# Patient Record
Sex: Female | Born: 1940 | Race: Black or African American | Hispanic: No | Smoking: Never smoker
Health system: Southern US, Community
[De-identification: ages and names within clinical notes are randomized; demographics above are authoritative.]

## PROBLEM LIST (undated history)

## (undated) DIAGNOSIS — H269 Unspecified cataract: Secondary | ICD-10-CM

## (undated) DIAGNOSIS — G4733 Obstructive sleep apnea (adult) (pediatric): Secondary | ICD-10-CM

## (undated) DIAGNOSIS — M1990 Unspecified osteoarthritis, unspecified site: Secondary | ICD-10-CM

## (undated) DIAGNOSIS — J301 Allergic rhinitis due to pollen: Secondary | ICD-10-CM

## (undated) DIAGNOSIS — J329 Chronic sinusitis, unspecified: Secondary | ICD-10-CM

## (undated) DIAGNOSIS — K529 Noninfective gastroenteritis and colitis, unspecified: Secondary | ICD-10-CM

## (undated) DIAGNOSIS — I2510 Atherosclerotic heart disease of native coronary artery without angina pectoris: Secondary | ICD-10-CM

## (undated) DIAGNOSIS — R05 Cough: Secondary | ICD-10-CM

## (undated) DIAGNOSIS — E785 Hyperlipidemia, unspecified: Secondary | ICD-10-CM

## (undated) DIAGNOSIS — G43809 Other migraine, not intractable, without status migrainosus: Secondary | ICD-10-CM

## (undated) DIAGNOSIS — K219 Gastro-esophageal reflux disease without esophagitis: Secondary | ICD-10-CM

## (undated) DIAGNOSIS — R059 Cough, unspecified: Secondary | ICD-10-CM

## (undated) DIAGNOSIS — F419 Anxiety disorder, unspecified: Secondary | ICD-10-CM

## (undated) DIAGNOSIS — I10 Essential (primary) hypertension: Secondary | ICD-10-CM

## (undated) DIAGNOSIS — J45909 Unspecified asthma, uncomplicated: Secondary | ICD-10-CM

## (undated) DIAGNOSIS — E039 Hypothyroidism, unspecified: Secondary | ICD-10-CM

## (undated) DIAGNOSIS — D649 Anemia, unspecified: Secondary | ICD-10-CM

## (undated) DIAGNOSIS — Z5189 Encounter for other specified aftercare: Secondary | ICD-10-CM

## (undated) DIAGNOSIS — T7840XA Allergy, unspecified, initial encounter: Secondary | ICD-10-CM

## (undated) DIAGNOSIS — IMO0001 Reserved for inherently not codable concepts without codable children: Secondary | ICD-10-CM

## (undated) HISTORY — PX: COLONOSCOPY: SHX174

## (undated) HISTORY — DX: Other migraine, not intractable, without status migrainosus: G43.809

## (undated) HISTORY — DX: Obstructive sleep apnea (adult) (pediatric): G47.33

## (undated) HISTORY — DX: Atherosclerotic heart disease of native coronary artery without angina pectoris: I25.10

## (undated) HISTORY — DX: Hyperlipidemia, unspecified: E78.5

## (undated) HISTORY — PX: CATARACT EXTRACTION W/ INTRAOCULAR LENS & ANTERIOR VITRECTOMY: SHX1308

## (undated) HISTORY — DX: Allergic rhinitis due to pollen: J30.1

## (undated) HISTORY — PX: EYE SURGERY: SHX253

## (undated) HISTORY — DX: Unspecified cataract: H26.9

## (undated) HISTORY — PX: PARATHYROID EXPLORATION: SHX732

## (undated) HISTORY — DX: Unspecified asthma, uncomplicated: J45.909

## (undated) HISTORY — DX: Allergy, unspecified, initial encounter: T78.40XA

## (undated) HISTORY — DX: Chronic sinusitis, unspecified: J32.9

## (undated) HISTORY — PX: POLYPECTOMY: SHX149

---

## 1966-07-11 HISTORY — PX: BREAST SURGERY: SHX581

## 1980-07-11 HISTORY — PX: TUBAL LIGATION: SHX77

## 1999-06-08 ENCOUNTER — Other Ambulatory Visit: Admission: RE | Admit: 1999-06-08 | Discharge: 1999-06-08 | Payer: Self-pay | Admitting: Obstetrics and Gynecology

## 1999-10-25 ENCOUNTER — Encounter (INDEPENDENT_AMBULATORY_CARE_PROVIDER_SITE_OTHER): Payer: Self-pay | Admitting: Specialist

## 1999-10-25 ENCOUNTER — Other Ambulatory Visit: Admission: RE | Admit: 1999-10-25 | Discharge: 1999-10-25 | Payer: Self-pay | Admitting: Obstetrics and Gynecology

## 2000-05-04 ENCOUNTER — Encounter: Admission: RE | Admit: 2000-05-04 | Discharge: 2000-05-04 | Payer: Self-pay | Admitting: *Deleted

## 2002-04-19 ENCOUNTER — Encounter: Payer: Self-pay | Admitting: Otolaryngology

## 2002-04-19 ENCOUNTER — Encounter: Admission: RE | Admit: 2002-04-19 | Discharge: 2002-04-19 | Payer: Self-pay | Admitting: Otolaryngology

## 2003-02-12 ENCOUNTER — Inpatient Hospital Stay (HOSPITAL_COMMUNITY): Admission: EM | Admit: 2003-02-12 | Discharge: 2003-02-13 | Payer: Self-pay | Admitting: Family Medicine

## 2003-02-12 ENCOUNTER — Encounter: Payer: Self-pay | Admitting: Family Medicine

## 2003-02-13 ENCOUNTER — Encounter: Payer: Self-pay | Admitting: Family Medicine

## 2003-03-06 ENCOUNTER — Encounter: Payer: Self-pay | Admitting: General Surgery

## 2003-03-10 ENCOUNTER — Encounter (INDEPENDENT_AMBULATORY_CARE_PROVIDER_SITE_OTHER): Payer: Self-pay | Admitting: *Deleted

## 2003-03-10 ENCOUNTER — Ambulatory Visit (HOSPITAL_COMMUNITY): Admission: RE | Admit: 2003-03-10 | Discharge: 2003-03-11 | Payer: Self-pay | Admitting: General Surgery

## 2005-07-01 ENCOUNTER — Encounter: Admission: RE | Admit: 2005-07-01 | Discharge: 2005-07-01 | Payer: Self-pay | Admitting: Pediatrics

## 2005-07-11 HISTORY — PX: THYROID SURGERY: SHX805

## 2006-10-06 ENCOUNTER — Ambulatory Visit (HOSPITAL_BASED_OUTPATIENT_CLINIC_OR_DEPARTMENT_OTHER): Admission: RE | Admit: 2006-10-06 | Discharge: 2006-10-06 | Payer: Self-pay | Admitting: Obstetrics and Gynecology

## 2006-10-06 ENCOUNTER — Encounter (INDEPENDENT_AMBULATORY_CARE_PROVIDER_SITE_OTHER): Payer: Self-pay | Admitting: *Deleted

## 2007-07-03 ENCOUNTER — Encounter: Admission: RE | Admit: 2007-07-03 | Discharge: 2007-07-03 | Payer: Self-pay | Admitting: Family Medicine

## 2009-06-09 ENCOUNTER — Emergency Department (HOSPITAL_COMMUNITY): Admission: EM | Admit: 2009-06-09 | Discharge: 2009-06-09 | Payer: Self-pay | Admitting: Emergency Medicine

## 2009-07-11 HISTORY — PX: CATARACT EXTRACTION W/ INTRAOCULAR LENS  IMPLANT, BILATERAL: SHX1307

## 2010-10-13 LAB — DIFFERENTIAL
Basophils Relative: 1 % (ref 0–1)
Eosinophils Absolute: 0.1 10*3/uL (ref 0.0–0.7)
Lymphocytes Relative: 28 % (ref 12–46)
Lymphs Abs: 1.2 10*3/uL (ref 0.7–4.0)
Monocytes Relative: 9 % (ref 3–12)
Neutro Abs: 2.7 10*3/uL (ref 1.7–7.7)
Neutrophils Relative %: 61 % (ref 43–77)

## 2010-10-13 LAB — POCT CARDIAC MARKERS
CKMB, poc: 2 ng/mL (ref 1.0–8.0)
Myoglobin, poc: 148 ng/mL (ref 12–200)
Troponin i, poc: <0.05 ng/mL (ref 0.00–0.09)

## 2010-10-13 LAB — CBC
MCHC: 33.5 g/dL (ref 30.0–36.0)
MCV: 86.6 fL (ref 78.0–100.0)
Platelets: 234 10*3/uL (ref 150–400)

## 2010-10-13 LAB — BASIC METABOLIC PANEL
BUN: 8 mg/dL (ref 6–23)
CO2: 34 mEq/L — ABNORMAL HIGH (ref 19–32)
Calcium: 9.4 mg/dL (ref 8.4–10.5)
GFR calc Af Amer: >60 mL/min (ref >60)
Sodium: 143 mEq/L (ref 135–145)

## 2010-11-26 NOTE — Op Note (Signed)
[name], Sara Baker                  ACCOUNT NO.:  [id number]   MEDICAL RECORD NO.:  [id number]          PATIENT TYPE:  AMB   LOCATION:  NESC                         FACILITY:  [facility]   PHYSICIAN:  Malachi Pro. Ambrose Mantle, M.D. DATE OF BIRTH:  30-Aug-1940   DATE OF PROCEDURE:  10/06/2006  DATE OF DISCHARGE:                               OPERATIVE REPORT   PREOPERATIVE DIAGNOSES:  Postmenopausal bleeding, endometrial polyp.   POSTOPERATIVE DIAGNOSES:  Postmenopausal bleeding, endometrial polyp.   OPERATION:  D&C, hysteroscopy, removal of large endometrial polyp.   OPERATOR:  Malachi Pro. Ambrose Mantle, M.D.   ANESTHESIA:  General.   The patient was brought to the operating room, placed under satisfactory  general anesthesia and placed in the dorsal lithotomy position.  Exam  revealed the uterus to be mid plane, normal size.  The adnexa were free  of masses.  The cervix was drawn into the operative field after prepping  with Betadine solution and draping as a sterile field and placing a  collection bag underneath the buttocks to collect the sorbitol.  The  uterus was sounded to about 3 inches, dilated up so that a hysteroscopic  sheath could be inserted containing a biopsy instrument, and then I was  able to see that the field was almost completely obscured by endometrial  polypoid tissue.  I therefore used the polyp forceps to try to reduce  the bulk of some of the tissue but could not seem to get a huge amount  of tissue.  Then with a combination of using the polyp forceps, uterine  dressing forceps and the hysteroscope as well as the hysteroscopic  biopsy instruments, I was able to reduce the bulk almost to zero but not  to zero.  There was a huge amount of tissue that was removed.  Some of  it appeared to possibly be consistent with neoplasia other than just a  simple polyp.  At the end of the procedure, a huge amount of tissue had  been removed.  The cavity was almost completely free of polypoid  tissue.  The cervical canal looked normal.  I did an endocervical curettage as  well as an endometrial curettage and preserved three specimens -- the  endocervical tissue, the endometrial tissue and the predominant amount  of tissue was from the endometrial polyp.   The patient seemed to tolerate the procedure well.  Blood loss was less  than 10 mL but during the procedure I did see some old blood which was  consistent with the patient's history of postmenopausal bleeding.  She  was returned to recovery in satisfactory condition.      Malachi Pro. Ambrose Mantle, M.D.  Electronically Signed     TFH/MEDQ  D:  10/06/2006  T:  10/06/2006  Job:  [id number]
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
## 2010-11-26 NOTE — Op Note (Signed)
NAMEVOULA, WALN                            ACCOUNT NO.:  [id number]   MEDICAL RECORD NO.:  [id number]                   PATIENT TYPE:  OIB   LOCATION:  2870                                 FACILITY:  [facility]   PHYSICIAN:  Sharlet Salina T. Hoxworth, M.D.          DATE OF BIRTH:  05-07-1941   DATE OF PROCEDURE:  03/10/2003  DATE OF DISCHARGE:                                 OPERATIVE REPORT   PREOPERATIVE DIAGNOSIS:  Parathyroid adenoma.   POSTOPERATIVE DIAGNOSIS:  Parathyroid adenoma.   OPERATION PERFORMED:  Minimally invasive parathyroidectomy.   SURGEON:  Lorne Skeens. Hoxworth, M.D.   ASSISTANT:  Velora Heckler, M.D.   ANESTHESIA:  General.   INDICATIONS FOR PROCEDURE:  Ms. Wolford is a 70 year old black female who was  recently found to have asymptomatic hypercalcemia, elevated at one point to  13.9.  She has undergone a thorough work-up that includes an elevated  parathyroid hormone level of 453 and a sestamibi scan indicating a left  inferior parathyroid adenoma.  A minimally invasive parathyroidectomy has  been recommended and accepted.  The nature of the procedure, its indications  and risks of bleeding, infection, possible need for full neck dissection,  injury to the recurrent laryngeal nerve with permanent hoarseness and  persistent hypercalcemia have been discussed and understood.  She is now  brought to the operating room for this procedure.   DESCRIPTION OF PROCEDURE:  The patient was brought to the operating room and  general endotracheal anesthesia was induced.  She had been injected in  nuclear medicine with sestamibi 75 minutes prior to the procedure.  Initially, the Neoprobe was used and there was an area of relatively  increased activity noted in the left inferior neck.  The neck was sterilely  prepped and draped.  A small about 2 cm incision was made in a skin crease  in the left neck transversely about 2 cm above the clavicle.  Platysma was  divided with the  cautery and small subplatysmal flaps were raised.  The  strap muscles were then divided along the midline and retracted laterally  and the anterior surface of the thyroid gland exposed.  The Neoprobe was  then again used to localize a relatively hot spot just off the inferior pole  of the left lobe of the thyroid.  Using careful blunt dissection and  mobilizing the thyroid gland medially, dissection was carried down onto a  very large typical-appearing parathyroid adenoma.  It was bluntly dissected  on its capsule away from the surrounding tissue and completely mobilized  down to its superior pedicle which was skeletonized down to small vessels  and clipped and divided and the gland completely removed.  It measured 3 cm  in length x 1 cm in diameter.  A small piece of Surgicel was placed in the  bed.  The tissue was sent for frozen section which confirmed parathyroid  tissue.  The  operative site was inspected for hemostasis which appeared  complete. The strap muscles were then closed  in the midline with  interrupted 3-0 Vicryl.  The platysma was closed with interrupted 3-0 Vicryl  and the skin closed with three staples and Steri-Strips.  The sponge, needle  and instrument counts were correct.  A dry sterile dressing was applied.  The patient was then transferred to the recovery room in good condition.                                                 Lorne Skeens. Hoxworth, M.D.    Tory Emerald  D:  03/10/2003  T:  03/10/2003  Job:  [id number]   cc:   Gretta Arab. Valentina Lucks, M.D.  [address]  [place]  [place] [address]  Fax: [phone]   Lilyan Punt. Sydnee Levans, M.D.  [address] [place]  [place] [address]  Fax: [phone]   Donia Guiles, M.D.  [address] [place]  [place] [address]  Fax: [phone]

## 2010-11-26 NOTE — Consult Note (Signed)
NAMEBRENDIA, Baker                            ACCOUNT NO.:  [id number]   MEDICAL RECORD NO.:  [id number]                   PATIENT TYPE:  OBV   LOCATION:  0345                                 FACILITY:  [facility]   PHYSICIAN:  Sara Baker, M.D.          DATE OF BIRTH:  [birth year]   DATE OF CONSULTATION:  02/13/2003  DATE OF DISCHARGE:                                   CONSULTATION   CHIEF COMPLAINT:  Elevated calcium.   HISTORY OF PRESENT ILLNESS:  Sara Baker is a 71 year old black female  admitted to [facility] yesterday by Sara Baker. Sara Baker, M.D. for  marked hypercalcemia.  I was asked to see the patient in consultation.  She  recently was seen as an outpatient at Sara Baker, M.D. office and  apparently routine laboratory work was done which has revealed a markedly  elevated calcium of 13.9.  The patient has had moderately elevated calcium  noted previously 11.9 in May of 2003, 13.3 two weeks ago.  At that time PTH  was also elevated 362.  The patient on questioning does state she has noted  increased fatigue and lethargy over the past four months.  No specific  muscle weakness.  Her daughter states she has noted some occasional facial  twitching.  She has not had any bone pain, pathologic fractures, or kidney  stones.  There is no history of malignancy.  Other than the fatigue, she has  really been asymptomatic.   PAST SURGICAL HISTORY:  1. Benign breast biopsy many years ago.  2. Bilateral tubal ligation.   PAST MEDICAL HISTORY:  1. Hypertension.  2. Depression/anxiety.  3. Ulcerative colitis by Sara Baker. Sara Baker, M.D. [facility].   CURRENT MEDICATIONS:  1. Xanax.  2. Hydrochlorothiazide.  3. Nexium.   ALLERGIES:  No known drug allergies.   SOCIAL HISTORY:  She is married.  Does not smoke cigarettes or drink  alcohol.   FAMILY HISTORY:  Mother died of a stroke.  Father died of a stroke.  No  history of cancer.   REVIEW OF SYSTEMS:  GENERAL:   Positive for some fatigue and lethargy as  above.  No fever, chills.  HEENT:  No neck pain, lump, difficulty  swallowing.  RESPIRATORY:  Denies shortness of breath, cough, wheezing.  CARDIAC:  She has had occasional chest pain and has negative Cardiolite.  This was felt due to anxiety.  No palpitations.  BREASTS:  No lumps,  tenderness, discharge.  ABDOMEN:  She has occasional alternating  constipation, diarrhea, and some crampy abdominal pain.  Followed by colitis  for Sara Baker. Sara Baker, M.D. [facility].  EXTREMITIES:  She has had some pain in  her right ankle and occasional swelling of the right foot.   PHYSICAL EXAMINATION:  VITAL SIGNS:  She is afebrile.  Pulse 100,  respirations 20, blood pressure 146/89.  GENERAL:  Well-developed black female in no acute distress.  SKIN:  Warm and dry without rash or infection.  HEENT:  No palpable masses or thyromegaly.  Sclerae nonicteric.  Naso/oropharynx clear.  LYMPH NODES:  No cervical, supraclavicular, axillary, or inguinal nodes  palpable.  BREASTS:  Without masses, tenderness, skin change, discharge.  LUNGS:  Clear to auscultation.  CARDIAC:  Regular rate and rhythm without murmurs.  No JVD or edema.  ABDOMEN:  Mildly distended, soft without tenderness, palpable masses, or  organomegaly.  EXTREMITIES:  No deformity, joint swelling.  NEUROLOGIC:  She is alert, fully oriented.  Motor and sensory are grossly  normal.   LABORATORIES:  CBC, BMET within normal limits.  Alkaline phosphatase  elevated at 134.  Calcium initially 13.9, now decreased to 11.4 post IV  fluids and Lasix.  Parathyroid hormone repeat elevated at 453.   CT scan of the abdomen and pelvis obtained which shows gallstones and a  contracted gallbladder.  There are very small low density areas in the liver  felt to be cysts.   Parathyroid scan was obtained __________ Sestamibi which shows persistent  increased uptake in the left inferior area consistent with a parathyroid   adenoma.   ASSESSMENT/PLAN:  Essentially asymptomatic marked hypercalcemia with  markedly elevated parathyroid hormone and Sestamibi scan indicating left  inferior adenoma.  I think the patient would benefit from minimally invasive  parathyroidectomy.  The patient from my point of view appears stable for  discharge and we will see her back in the office and schedule her procedure  at [facility] where the __________ is available for localization.  The  procedure was discussed initially with the patient today and will be  discussed further in the office.                                               Sara Baker. Baker, M.D.    Sara Baker  D:  02/13/2003  T:  02/13/2003  Job:  [id number]

## 2010-11-26 NOTE — Discharge Summary (Signed)
Sara Baker, DULA                            ACCOUNT NO.:  [id number]   MEDICAL RECORD NO.:  [id number]                   PATIENT TYPE:  OBV   LOCATION:  0345                                 FACILITY:  [facility]   PHYSICIAN:  Lilyan Punt. Sydnee Levans, M.D.             DATE OF BIRTH:  1940-08-04   DATE OF ADMISSION:  02/12/2003  DATE OF DISCHARGE:  02/13/2003                                 DISCHARGE SUMMARY   DIAGNOSES:  1. Parathyroid adenoma, left inferior parathyroid gland, per sestamibi scan     on February 13, 2003 at [facility]  2. Asymptomatic hypercalcemia secondary to above.  3. Hypertension.   DISCHARGE MEDICATIONS:  1. Lasix 20 mg b.i.d.  2. Potassium 20 mEq p.o. b.i.d.   FOLLOW UP:  1. With Dr. Johna Sheriff next week in his office for preoperative assessment     before the minimally invasive parathyroid surgery which will be done at     [facility].  2. Dr. Maurice Small next week for lab assessment and BMET.  3. [facility] as previously arranged by Dr. Valentina Lucks in work-up of     atypical chest pain - February 17, 2003.   DISCUSSION:  This patient presented with asymptomatic hypercalcemia to Dr.  Marcy Siren office and was sent directly to the hospital by Dr.  __________ after followup lab testing showed hypercalcemia with calcium  level of 13.9, and an elevated PTH of 361.  The patient underwent saline  diuresis to acutely manage hypercalcemia.  The thiazide diuretic that she  had been taking was also discontinued.  The patient responded well to this  therapy.  She was seen by surgery with his findings and recommendations  discussed; see dictation.   Hospital work-up has included CT of the pelvis and abdomen which showed  findings of minimal significance at this time.  There were sub-centimeter  low density lesions involving the right and left lobes of the liver which  were too small to characterize, but could represent hepatic or biliary  cysts.  The gallbladder was grossly normal, but had incidental findings of  cholelithiasis.  There is a decrease in size of the left kidney, which could  be consistent with renal artery stenosis.  There is an incidental  diverticulum without evidence of obstruction in the third portion of the  duodenum.  These findings were felt to be incidental, and should be followed  in the outpatient setting by the primary physician and surgeon once the  primary issue or parathyroid adenoma has been definitively treated.    PERTINENT LABORATORIES:  TSH was 2.022.  Ionized calcium 1.81.  BMET  revealing a sodium of 141, potassium 4.1, chloride 111, CO2 28, glucose 19,  BUN 8, creatinine 0.9.  Alkaline phosphatase was 121.  Parathyroid hormone  intact at 453.7.  Phosphorus 2.5, magnesium 1.8, PTT 33, PT 12.8.  CBC  revealed  a normal white blood cell count at 4.4, hemoglobin 12.0, hematocrit  36.2, platelet count 190.                                               Lilyan Punt Sydnee Levans, M.D.    KCS/MEDQ  D:  02/13/2003  T:  02/13/2003  Job:  [id number]   cc:   Gretta Arab. Valentina Lucks, M.D.  [address] [place]  [place] [address]  Fax: [phone]   Lorne Skeens. Hoxworth, M.D.  [address]  [place]  [place] [address]  Fax: [phone]   [facility]

## 2011-07-12 HISTORY — PX: DILATION AND CURETTAGE OF UTERUS: SHX78

## 2011-07-19 DIAGNOSIS — E039 Hypothyroidism, unspecified: Secondary | ICD-10-CM | POA: Diagnosis not present

## 2011-07-19 DIAGNOSIS — J329 Chronic sinusitis, unspecified: Secondary | ICD-10-CM | POA: Diagnosis not present

## 2011-09-01 ENCOUNTER — Encounter (HOSPITAL_COMMUNITY): Payer: Self-pay | Admitting: Pharmacist

## 2011-09-05 ENCOUNTER — Other Ambulatory Visit: Payer: Self-pay

## 2011-09-05 ENCOUNTER — Encounter (HOSPITAL_COMMUNITY): Payer: Self-pay

## 2011-09-05 ENCOUNTER — Encounter (HOSPITAL_COMMUNITY)
Admission: RE | Admit: 2011-09-05 | Discharge: 2011-09-05 | Disposition: A | Discharge status: Home / Self Care | Payer: BC Managed Care – PPO | Source: Ambulatory Visit | Attending: Obstetrics and Gynecology | Admitting: Obstetrics and Gynecology

## 2011-09-05 HISTORY — DX: Hypothyroidism, unspecified: E03.9

## 2011-09-05 HISTORY — DX: Encounter for other specified aftercare: Z51.89

## 2011-09-05 HISTORY — DX: Noninfective gastroenteritis and colitis, unspecified: K52.9

## 2011-09-05 HISTORY — DX: Anxiety disorder, unspecified: F41.9

## 2011-09-05 HISTORY — DX: Essential (primary) hypertension: I10

## 2011-09-05 HISTORY — DX: Gastro-esophageal reflux disease without esophagitis: K21.9

## 2011-09-05 HISTORY — DX: Reserved for inherently not codable concepts without codable children: IMO0001

## 2011-09-05 LAB — CBC
Hemoglobin: 12.8 g/dL (ref 12.0–15.0)
RBC: 4.68 MIL/uL (ref 3.87–5.11)

## 2011-09-05 LAB — BASIC METABOLIC PANEL: BUN: 10 mg/dL (ref 6–23)

## 2011-09-05 NOTE — Patient Instructions (Signed)
YOUR PROCEDURE IS SCHEDULED ON:09/14/11  ENTER THROUGH THE MAIN ENTRANCE OF [facility] AT:0730 am  USE DESK PHONE AND DIAL [phone] TO INFORM us OF YOUR ARRIVAL  CALL [phone] IF YOU HAVE ANY QUESTIONS OR PROBLEMS PRIOR TO YOUR ARRIVAL.  REMEMBER: DO NOT EAT OR DRINK AFTER MIDNIGHT :Tuesday  SPECIAL INSTRUCTIONS:   YOU MAY BRUSH YOUR TEETH THE MORNING OF SURGERY   TAKE THESE MEDICINES THE DAY OF SURGERY WITH SIP OF WATER: BP pill   DO NOT WEAR JEWELRY, EYE MAKEUP, LIPSTICK OR DARK FINGERNAIL POLISH DO NOT WEAR LOTIONS DO NOT SHAVE FOR 48 HOURS PRIOR TO SURGERY  YOU WILL NOT BE ALLOWED TO DRIVE YOURSELF HOME.  NAME OF DRIVER:daughter- Elnita Maxwell

## 2011-09-06 NOTE — Pre-Procedure Instructions (Signed)
Results of low K+ given to [employer] (3.2). Protocol requires we report results < 3.1 but pt not having surgery for another week. In an attempt to keep K+ from becoming lower, I called pt to advise to eat bananas daily until surgery  if she is able. Dr. Jean Rosenthal approved of me calling pt with these instructions.

## 2011-09-08 DIAGNOSIS — I10 Essential (primary) hypertension: Secondary | ICD-10-CM | POA: Diagnosis not present

## 2011-09-08 DIAGNOSIS — J309 Allergic rhinitis, unspecified: Secondary | ICD-10-CM | POA: Diagnosis not present

## 2011-09-12 NOTE — H&P (Signed)
NAME:  Sara Baker, Sara Baker                       ACCOUNT NO.:  MEDICAL RECORD NO.:  [id number]  LOCATION:                                 FACILITY:  PHYSICIAN:  Malachi Pro. Ambrose Mantle, M.D. DATE OF BIRTH:  August 26, 1940  DATE OF ADMISSION: DATE OF DISCHARGE:                             HISTORY & PHYSICAL   ESTIMATED DATE OF ADMISSION:  September 14, 2011.  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  This patient was seen on August 15, 2011, complaining that she had begun bleeding 2 weeks prior to that after 29 years without any bleeding.  Her Pap smear was normal. Her endometrial biopsy was reported as previously stated.  She was scheduled for D and C to rule out a more significant lesion and to get a microscopic evaluation of the entire endometrium.  PAST MEDICAL HISTORY:  Her past medical history reveals, NO KNOWN DRUG ALLERGIES, illnesses, high blood pressure, colitis, and anxiety.  PAST SURGICAL HISTORY:  Bilateral tubal ligation, breast mass, parathyroidectomy, and cataracts on both eyes.  MEDICATIONS:  Benazepril and hydrochlorothiazide 20/25 one a day.  FAMILY HISTORY:  Mother died at 8 of high blood pressure and stroke. Father died at 7 of high blood pressure and stroke.  She had 6 brothers and 6 sisters; only 2 sisters remain.  There is a combination of illnesses including alcoholism, cancer, lymphoma, and myocardial infarction.  Alcohol, tobacco, and drugs none.  The patient did have 5 vaginal deliveries.  PHYSICAL EXAMINATION:  GENERAL:  This is a well-developed, well- nourished black female in no distress. VITAL SIGNS:  Her blood pressure is 160/90, pulse is 80. HEAD, EYES, NOSE, AND THROAT:  No abnormalities.  She has an upper dental plate and a lower partial plate. NECK:   Supple without thyromegaly. LUNGS:  Clear to auscultation. HEART:  Normal size and sounds.  No murmurs. ABDOMEN:  Soft and nontender.  There are no masses palpable.  Liver, spleen, and kidneys are not felt. GU:  The pelvic exam is not repeated, done on August 15, 2011.  Her external genitalia were normal.  The vagina had no discharge at that time.  The cervix was healthy in appearance.  The uterus was nontender, thought to be midplane, normal size.  The adnexa were free of masses and rectal exam was negative.  ADMITTING IMPRESSION:  History of postmenopausal bleeding, endometrial hyperplasia with cytologic atypia on endometrial biopsy.  The patient is admitted for D and C and hysteroscopy.  At the time the endometrial biopsy was done, the Pipelle felt that it pulled on a structure and could not retrieve the structure suggesting that there was a polyp present.  The risks of the surgery have been discussed with the patient.     Malachi Pro. Ambrose Mantle, M.D.     TFH/MEDQ  D:  09/12/2011  T:  09/12/2011  Job:  [id number]

## 2011-09-14 ENCOUNTER — Encounter (HOSPITAL_COMMUNITY): Payer: Self-pay | Admitting: Anesthesiology

## 2011-09-14 ENCOUNTER — Ambulatory Visit (HOSPITAL_COMMUNITY)
Admission: RE | Admit: 2011-09-14 | Discharge: 2011-09-14 | Disposition: A | Discharge status: Home / Self Care | Payer: BC Managed Care – PPO | Source: Ambulatory Visit | Attending: Obstetrics and Gynecology | Admitting: Obstetrics and Gynecology

## 2011-09-14 ENCOUNTER — Encounter (HOSPITAL_COMMUNITY): Payer: Self-pay | Admitting: *Deleted

## 2011-09-14 ENCOUNTER — Ambulatory Visit (HOSPITAL_COMMUNITY): Payer: BC Managed Care – PPO | Admitting: Anesthesiology

## 2011-09-14 ENCOUNTER — Encounter (HOSPITAL_COMMUNITY)
Admission: RE | Disposition: A | Discharge status: Home / Self Care | Payer: Self-pay | Source: Ambulatory Visit | Attending: Obstetrics and Gynecology

## 2011-09-14 DIAGNOSIS — N950 Postmenopausal bleeding: Secondary | ICD-10-CM | POA: Insufficient documentation

## 2011-09-14 DIAGNOSIS — N840 Polyp of corpus uteri: Secondary | ICD-10-CM | POA: Insufficient documentation

## 2011-09-14 DIAGNOSIS — Z01818 Encounter for other preprocedural examination: Secondary | ICD-10-CM | POA: Insufficient documentation

## 2011-09-14 DIAGNOSIS — N8502 Endometrial intraepithelial neoplasia [EIN]: Secondary | ICD-10-CM

## 2011-09-14 DIAGNOSIS — Z01812 Encounter for preprocedural laboratory examination: Secondary | ICD-10-CM | POA: Insufficient documentation

## 2011-09-14 SURGERY — DILATATION & CURETTAGE/HYSTEROSCOPY WITH RESECTOCOPE
Anesthesia: General | Site: Vagina | Wound class: Clean Contaminated

## 2011-09-14 MED ORDER — PHENYLEPHRINE HCL 10 MG/ML IJ SOLN
INTRAMUSCULAR | Status: DC | PRN
  Administered 2011-09-14 (×2): 80 ug via INTRAVENOUS
  Administered 2011-09-14: 40 ug via INTRAVENOUS

## 2011-09-14 MED ORDER — GLYCINE 1.5 % IR SOLN
Status: DC | PRN
  Administered 2011-09-14: 3000 mL

## 2011-09-14 MED ORDER — DEXAMETHASONE SODIUM PHOSPHATE 4 MG/ML IJ SOLN
INTRAMUSCULAR | Status: DC | PRN
  Administered 2011-09-14: 10 mg via INTRAVENOUS

## 2011-09-14 MED ORDER — LIDOCAINE HCL (CARDIAC) 20 MG/ML IV SOLN
INTRAVENOUS | Status: DC | PRN
  Administered 2011-09-14: 100 mg via INTRAVENOUS

## 2011-09-14 MED ORDER — LACTATED RINGERS IV SOLN
INTRAVENOUS | Status: DC
  Administered 2011-09-14 (×3): via INTRAVENOUS

## 2011-09-14 MED ORDER — FENTANYL CITRATE 0.05 MG/ML IJ SOLN: 25.0000 ug | INTRAMUSCULAR | Status: DC | PRN

## 2011-09-14 MED ORDER — DEXAMETHASONE SODIUM PHOSPHATE 10 MG/ML IJ SOLN
INTRAMUSCULAR | Status: AC
  Filled 2011-09-14: qty 1

## 2011-09-14 MED ORDER — KETOROLAC TROMETHAMINE 30 MG/ML IJ SOLN
INTRAMUSCULAR | Status: DC | PRN
  Administered 2011-09-14: 30 mg via INTRAVENOUS

## 2011-09-14 MED ORDER — FENTANYL CITRATE 0.05 MG/ML IJ SOLN
INTRAMUSCULAR | Status: DC | PRN
  Administered 2011-09-14: 50 ug via INTRAVENOUS

## 2011-09-14 MED ORDER — PROPOFOL 10 MG/ML IV EMUL
INTRAVENOUS | Status: AC
  Filled 2011-09-14: qty 20

## 2011-09-14 MED ORDER — ONDANSETRON HCL 4 MG/2ML IJ SOLN
INTRAMUSCULAR | Status: AC
  Filled 2011-09-14: qty 2

## 2011-09-14 MED ORDER — ONDANSETRON HCL 4 MG/2ML IJ SOLN
INTRAMUSCULAR | Status: DC | PRN
  Administered 2011-09-14: 4 mg via INTRAVENOUS

## 2011-09-14 MED ORDER — PROPOFOL 10 MG/ML IV EMUL
INTRAVENOUS | Status: DC | PRN
  Administered 2011-09-14: 200 mg via INTRAVENOUS

## 2011-09-14 MED ORDER — LIDOCAINE HCL (CARDIAC) 20 MG/ML IV SOLN
INTRAVENOUS | Status: AC
  Filled 2011-09-14: qty 5

## 2011-09-14 MED ORDER — MIDAZOLAM HCL 5 MG/5ML IJ SOLN
INTRAMUSCULAR | Status: DC | PRN
  Administered 2011-09-14: 2 mg via INTRAVENOUS

## 2011-09-14 MED ORDER — LIDOCAINE HCL 1 % IJ SOLN
INTRAMUSCULAR | Status: DC | PRN
  Administered 2011-09-14: 9 mL

## 2011-09-14 MED ORDER — MIDAZOLAM HCL 2 MG/2ML IJ SOLN
INTRAMUSCULAR | Status: AC
  Filled 2011-09-14: qty 6

## 2011-09-14 MED ORDER — FENTANYL CITRATE 0.05 MG/ML IJ SOLN
INTRAMUSCULAR | Status: AC
  Filled 2011-09-14: qty 5

## 2011-09-14 MED ORDER — ACETAMINOPHEN 325 MG PO TABS: 325.0000 mg | ORAL_TABLET | ORAL | Status: DC | PRN

## 2011-09-14 SURGICAL SUPPLY — 19 items
CANISTER SUCTION 2500CC (MISCELLANEOUS) ×2 IMPLANT
CATH ROBINSON RED A/P 16FR (CATHETERS) ×2 IMPLANT
CLOTH BEACON ORANGE TIMEOUT ST (SAFETY) ×2 IMPLANT
CONTAINER PREFILL 10% NBF 60ML (FORM) ×4 IMPLANT
CORD ACTIVE DISPOSABLE (ELECTRODE)
CORD ELECTRO ACTIVE DISP (ELECTRODE) IMPLANT
ELECT LOOP GYNE PRO 24FR (CUTTING LOOP)
ELECT REM PT RETURN 9FT ADLT (ELECTROSURGICAL) ×2
ELECT VAPORTRODE GRVD BAR (ELECTRODE) IMPLANT
ELECTRODE LOOP GYNE PRO 24FR (CUTTING LOOP) IMPLANT
ELECTRODE REM PT RTRN 9FT ADLT (ELECTROSURGICAL) ×1 IMPLANT
GLOVE BIO SURGEON STRL SZ7.5 (GLOVE) ×4 IMPLANT
GOWN PREVENTION PLUS LG XLONG (DISPOSABLE) ×2 IMPLANT
GOWN PREVENTION PLUS XLARGE (GOWN DISPOSABLE) ×2 IMPLANT
GOWN STRL REIN XL XLG (GOWN DISPOSABLE) ×2 IMPLANT
LOOP ANGLED CUTTING 22FR (CUTTING LOOP) IMPLANT
PACK HYSTEROSCOPY LF (CUSTOM PROCEDURE TRAY) ×2 IMPLANT
TOWEL OR 17X24 6PK STRL BLUE (TOWEL DISPOSABLE) ×4 IMPLANT
WATER STERILE IRR 1000ML POUR (IV SOLUTION) ×2 IMPLANT

## 2011-09-14 NOTE — Anesthesia Preprocedure Evaluation (Signed)
Anesthesia Evaluation  Patient identified by MRN, date of birth, ID band Patient awake    Reviewed: Allergy & Precautions, H&P , Patient's Chart, lab work & pertinent test results, reviewed documented beta blocker date and time   History of Anesthesia Complications Negative for: history of anesthetic complications  Airway Mallampati: II TM Distance: >3 FB Neck ROM: limited    Dental No notable dental hx.    Pulmonary neg pulmonary ROS,  breath sounds clear to auscultation  Pulmonary exam normal       Cardiovascular Exercise Tolerance: Good hypertension, negative cardio ROS  Rhythm:regular Rate:Normal     Neuro/Psych PSYCHIATRIC DISORDERS Anxiety negative neurological ROS  negative psych ROS   GI/Hepatic negative GI ROS, Neg liver ROS,   Endo/Other  negative endocrine ROS  Renal/GU negative Renal ROS     Musculoskeletal   Abdominal   Peds  Hematology negative hematology ROS (+)   Anesthesia Other Findings Hypertension     Anxiety        Hypothyroidism     GERD (gastroesophageal reflux disease)        Blood transfusion 10 + yrs Wash DC Colitis    Reproductive/Obstetrics negative OB ROS                           Anesthesia Physical Anesthesia Plan  ASA: II  Anesthesia Plan: General LMA   Post-op Pain Management:    Induction:   Airway Management Planned:   Additional Equipment:   Intra-op Plan:   Post-operative Plan:   Informed Consent: I have reviewed the patients History and Physical, chart, labs and discussed the procedure including the risks, benefits and alternatives for the proposed anesthesia with the patient or authorized representative who has indicated his/her understanding and acceptance.   Dental Advisory Given  Plan Discussed with: CRNA, Surgeon and Anesthesiologist  Anesthesia Plan Comments:         Anesthesia Quick Evaluation

## 2011-09-14 NOTE — Op Note (Signed)
Operative note on Sara Baker:  Date of the operation: 09/14/2011  Preoperative diagnosis: Postmenopausal bleeding, atypical endometrial hyperplasia  Postoperative diagnosis: Multiple endometrial polyps with some firm and yellow tissue removed during the polypectomies  Operation: D&C, hysteroscopy removal of polyps  Operator: Ambrose Mantle  Anesthesia: Gen. Dr. Brayton Caves  The patient was brought to the operating room and placed under satisfactory general anesthesia and placed in lithotomy position. The vulva vagina perineum and urethra were prepped with Betadine solution and the bladder was emptied with a [place] catheter. There was a significant cystocele and prolapse of the cervix. The area was draped as a sterile field, the cervix was exposed with a weighted speculum posteriorly and a Sims retractor anteriorly, and the anterior cervical lip was grasped with a tenaculum. The uterus sounded  8 cm anteriorly. The cervix was dilated with Shawnie Pons dilators so it would easily admit the hysteroscope. Multiple polyps were seen inside the endometrial cavity. I removed the hysteroscope and began debulking the polyps with polyp forceps and with the baby ring forceps. There was a large amount of tissue removed including some tissue that was yellow and firm. I looked again with the hysteroscope and there was no significant polypoid tissue remaining. I did a fractional D&C and terminated the procedure. Blood loss was about 5 cc. The patient was returned to recovery in satisfactory condition

## 2011-09-14 NOTE — Anesthesia Postprocedure Evaluation (Signed)
Anesthesia Post Note  Patient: Sara Baker  Procedure(s) Performed: Procedure(s) (LRB): DILATATION & CURETTAGE/HYSTEROSCOPY WITH RESECTOCOPE (N/A)  Anesthesia type: General  Patient location: PACU  Post pain: Pain level controlled  Post assessment: Post-op Vital signs reviewed  Last Vitals:  Filed Vitals:   09/14/11 1120  BP:   Pulse: 84  Temp:   Resp: 11    Post vital signs: Reviewed  Level of consciousness: sedated  Complications: No apparent anesthesia complications

## 2011-09-14 NOTE — Progress Notes (Signed)
Patient ID: Sara Baker, female   DOB: July 26, 1940, 71 y.o.   MRN: [id number] Pt was examined on 09-12-11 and she reports no major change in her health since that time.

## 2011-09-14 NOTE — Transfer of Care (Signed)
Immediate Anesthesia Transfer of Care Note  Patient: Sara Baker  Procedure(s) Performed: Procedure(s) (LRB): DILATATION & CURETTAGE/HYSTEROSCOPY WITH RESECTOCOPE (N/A)  Patient Location: PACU  Anesthesia Type: General  Level of Consciousness: awake, alert  and oriented  Airway & Oxygen Therapy: Patient Spontanous Breathing and Patient connected to nasal cannula oxygen  Post-op Assessment: Report given to PACU RN and Post -op Vital signs reviewed and stable  Post vital signs: Reviewed and stable  Complications: No apparent anesthesia complications

## 2011-09-14 NOTE — Anesthesia Procedure Notes (Signed)
Procedure Name: LMA Insertion Date/Time: 09/14/2011 9:11 AM Performed by: Edison Pace Pre-anesthesia Checklist: Patient identified, Patient being monitored, Emergency Drugs available and Timeout performed Patient Re-evaluated:Patient Re-evaluated prior to inductionOxygen Delivery Method: Circle system utilized Preoxygenation: Pre-oxygenation with 100% oxygen Intubation Type: IV induction Ventilation: Mask ventilation without difficulty LMA: LMA inserted LMA Size: 4.0 Number of attempts: 1

## 2011-09-14 NOTE — Discharge Instructions (Signed)
No vaginal entrance, call with temp > 100.4, with heavy vaginal bleeding , or with any problems.DISCHARGE INSTRUCTIONS: D&C / D&E The following instructions have been prepared to help you care for yourself upon your return home.   Personal hygiene: Marland Kitchen Use sanitary pads for vaginal drainage, not tampons. . Shower the day after your procedure. . NO tub baths, pools or Jacuzzis for 2-3 weeks. . Wipe front to back after using the bathroom.  Activity and limitations: . Do NOT drive or operate any equipment for 24 hours. The effects of anesthesia are still present and drowsiness may result. . Do NOT rest in bed all day. . Walking is encouraged. . Walk up and down stairs slowly. . You may resume your normal activity in one to two days or as indicated by your physician.  Sexual activity: NO intercourse for at least 2 weeks after the procedure, or as indicated by your physician.  Diet: Eat a light meal as desired this evening. You may resume your usual diet tomorrow.  Return to work: You may resume your work activities in one to two days or as indicated by your doctor.  What to expect after your surgery: Expect to have vaginal bleeding/discharge for 2-3 days and spotting for up to 10 days. It is not unusual to have soreness for up to 1-2 weeks. You may have a slight burning sensation when you urinate for the first day. Mild cramps may continue for a couple of days. You may have a regular period in 2-6 weeks.  Call your doctor for any of the following: . Excessive vaginal bleeding, saturating and changing one pad every hour. . Inability to urinate 6 hours after discharge from hospital. . Pain not relieved by pain medication. . Fever of 100.4 F or greater. . Unusual vaginal discharge or odor.  Return to office ________________ Call for an appointment ___________________  Patient's signature: ______________________  Nurse's signature ________________________  Post Anesthesia Care Unit  [phone]

## 2011-10-26 DIAGNOSIS — I10 Essential (primary) hypertension: Secondary | ICD-10-CM | POA: Diagnosis not present

## 2011-10-26 DIAGNOSIS — R609 Edema, unspecified: Secondary | ICD-10-CM | POA: Diagnosis not present

## 2011-11-07 ENCOUNTER — Encounter: Payer: Self-pay | Admitting: Gastroenterology

## 2011-12-15 ENCOUNTER — Ambulatory Visit (AMBULATORY_SURGERY_CENTER): Payer: BC Managed Care – PPO | Admitting: *Deleted

## 2011-12-15 VITALS — Ht 64.5 in | Wt 180.0 lb

## 2011-12-15 DIAGNOSIS — Z1211 Encounter for screening for malignant neoplasm of colon: Secondary | ICD-10-CM

## 2011-12-15 MED ORDER — PEG-KCL-NACL-NASULF-NA ASC-C 100 G PO SOLR: ORAL | Status: DC

## 2011-12-28 ENCOUNTER — Ambulatory Visit (AMBULATORY_SURGERY_CENTER): Payer: BC Managed Care – PPO | Admitting: Gastroenterology

## 2011-12-28 ENCOUNTER — Encounter: Payer: Self-pay | Admitting: Gastroenterology

## 2011-12-28 VITALS — BP 145/86 | HR 84 | Temp 97.1°F | Resp 11 | Ht 64.0 in | Wt 180.0 lb

## 2011-12-28 DIAGNOSIS — Z1211 Encounter for screening for malignant neoplasm of colon: Secondary | ICD-10-CM

## 2011-12-28 DIAGNOSIS — D126 Benign neoplasm of colon, unspecified: Secondary | ICD-10-CM | POA: Diagnosis not present

## 2011-12-28 DIAGNOSIS — F411 Generalized anxiety disorder: Secondary | ICD-10-CM | POA: Diagnosis not present

## 2011-12-28 MED ORDER — SODIUM CHLORIDE 0.9 % IV SOLN: 500.0000 mL | INTRAVENOUS | Status: DC

## 2011-12-28 NOTE — Patient Instructions (Addendum)
YOU HAD AN ENDOSCOPIC PROCEDURE TODAY AT THE [facility]: Refer to the procedure report that was given to you for any specific questions about what was found during the examination.  If the procedure report does not answer your questions, please call your gastroenterologist to clarify.  If you requested that your care partner not be given the details of your procedure findings, then the procedure report has been included in a sealed envelope for you to review at your convenience later.  YOU SHOULD EXPECT: Some feelings of bloating in the abdomen. Passage of more gas than usual.  Walking can help get rid of the air that was put into your GI tract during the procedure and reduce the bloating. If you had a lower endoscopy (such as a colonoscopy or flexible sigmoidoscopy) you may notice spotting of blood in your stool or on the toilet paper. If you underwent a bowel prep for your procedure, then you may not have a normal bowel movement for a few days.  DIET: Your first meal following the procedure should be a light meal and then it is ok to progress to your normal diet.  A half-sandwich or bowl of soup is an example of a good first meal.  Heavy or fried foods are harder to digest and may make you feel nauseous or bloated.  Likewise meals heavy in dairy and vegetables can cause extra gas to form and this can also increase the bloating.  Drink plenty of fluids but you should avoid alcoholic beverages for 24 hours.  ACTIVITY: Your care partner should take you home directly after the procedure.  You should plan to take it easy, moving slowly for the rest of the day.  You can resume normal activity the day after the procedure however you should NOT DRIVE or use heavy machinery for 24 hours (because of the sedation medicines used during the test).    SYMPTOMS TO REPORT IMMEDIATELY: A gastroenterologist can be reached at any hour.  During normal business hours, 8:30 AM to 5:00 PM Monday through Friday,  call [phone].  After hours and on weekends, please call the GI answering service at [phone] who will take a message and have the physician on call contact you.   Following lower endoscopy (colonoscopy or flexible sigmoidoscopy):  Excessive amounts of blood in the stool  Significant tenderness or worsening of abdominal pains  Swelling of the abdomen that is new, acute  Fever of 100F or higher    FOLLOW UP: If any biopsies were taken you will be contacted by phone or by letter within the next 1-3 weeks.  Call your gastroenterologist if you have not heard about the biopsies in 3 weeks.  Our staff will call the home number listed on your records the next business day following your procedure to check on you and address any questions or concerns that you may have at that time regarding the information given to you following your procedure. This is a courtesy call and so if there is no answer at the home number and we have not heard from you through the emergency physician on call, we will assume that you have returned to your regular daily activities without incident.  SIGNATURES/CONFIDENTIALITY: You and/or your care partner have signed paperwork which will be entered into your electronic medical record.  These signatures attest to the fact that that the information above on your After Visit Summary has been reviewed and is understood.  Full responsibility of the confidentiality   of this discharge information lies with you and/or your care-partner.     

## 2011-12-28 NOTE — Op Note (Signed)
[facility] [address] [place], [place]  [address]  COLONOSCOPY PROCEDURE REPORT  PATIENT:  Sara Baker, Sara Baker  MR#:  [id number] BIRTHDATE:  [birth year], 70 yrs. old  GENDER:  female ENDOSCOPIST:  Vania Rea. Jarold Motto, MD, [facility] REF. BY: PROCEDURE DATE:  12/28/2011 PROCEDURE:  Colonoscopy with snare polypectomy ASA CLASS:  Class II INDICATIONS:  Routine Risk Screening MEDICATIONS:   propofol (Diprivan) 200 mg IV  DESCRIPTION OF PROCEDURE:   After the risks and benefits and of the procedure were explained, informed consent was obtained. Digital rectal exam was performed and revealed no abnormalities. The LB CF-H180AL [id number] endoscope was introduced through the anus and advanced to the cecum, which was identified by both the appendix and ileocecal valve.  The quality of the prep was excellent, using MoviPrep.  The instrument was then slowly withdrawn as the colon was fully examined. <<PROCEDUREIMAGES>>  FINDINGS:  There were multiple polyps identified and removed. found scattered throught the colon. 5-10 mm flat polyps cold snare removed scattered in right and left colon  This was otherwise a normal examination of the colon.   Retroflexed views in the rectum revealed no abnormalities.    The scope was then withdrawn from the patient and the procedure completed.  COMPLICATIONS:  None ENDOSCOPIC IMPRESSION: 1) Polyps, multiple found scattered throught the colon 2) Otherwise normal examination probable adenomas. RECOMMENDATIONS: 1) Await pathology results 2) Repeat colonoscopy in 5 years if polyp adenomatous; otherwise 10 years  REPEAT EXAM:  No  ______________________________ Vania Rea. Jarold Motto, MD, [facility]  CC:  n. eSIGNED:   Vania Rea. Samayah Novinger at 12/28/2011 09:14 AM  Theodosia Blender, [id number]

## 2011-12-28 NOTE — Progress Notes (Signed)
Patient did not experience any of the following events: a burn prior to discharge; a fall within the facility; wrong site/side/patient/procedure/implant event; or a hospital transfer or hospital admission upon discharge from the facility. (G8907) Patient did not have preoperative order for IV antibiotic SSI prophylaxis. (G8918)  

## 2011-12-29 ENCOUNTER — Telehealth: Payer: Self-pay

## 2011-12-29 NOTE — Telephone Encounter (Signed)
Left message on answering machine. 

## 2012-01-03 ENCOUNTER — Encounter: Payer: Self-pay | Admitting: Gastroenterology

## 2012-05-02 DIAGNOSIS — N8502 Endometrial intraepithelial neoplasia [EIN]: Secondary | ICD-10-CM | POA: Diagnosis not present

## 2012-05-16 DIAGNOSIS — N8500 Endometrial hyperplasia, unspecified: Secondary | ICD-10-CM | POA: Diagnosis not present

## 2012-05-29 ENCOUNTER — Other Ambulatory Visit: Payer: Self-pay | Admitting: Obstetrics and Gynecology

## 2012-06-29 ENCOUNTER — Encounter (HOSPITAL_COMMUNITY): Payer: Self-pay

## 2012-06-29 ENCOUNTER — Encounter (HOSPITAL_COMMUNITY)
Admission: RE | Admit: 2012-06-29 | Discharge: 2012-06-29 | Disposition: A | Discharge status: Home / Self Care | Payer: BC Managed Care – PPO | Source: Ambulatory Visit | Attending: Obstetrics and Gynecology | Admitting: Obstetrics and Gynecology

## 2012-06-29 HISTORY — DX: Unspecified osteoarthritis, unspecified site: M19.90

## 2012-06-29 HISTORY — DX: Cough: R05

## 2012-06-29 HISTORY — DX: Anemia, unspecified: D64.9

## 2012-06-29 HISTORY — DX: Cough, unspecified: R05.9

## 2012-06-29 LAB — CBC
Hemoglobin: 12.2 g/dL (ref 12.0–15.0)
Platelets: 209 10*3/uL (ref 150–400)
RBC: 4.38 MIL/uL (ref 3.87–5.11)
WBC: 5.1 10*3/uL (ref 4.0–10.5)

## 2012-06-29 LAB — COMPREHENSIVE METABOLIC PANEL
AST: 27 U/L (ref 0–37)
CO2: 33 mEq/L — ABNORMAL HIGH (ref 19–32)
Chloride: 101 mEq/L (ref 96–112)
Creatinine, Ser: 1.05 mg/dL (ref 0.50–1.10)
GFR calc Af Amer: 60 mL/min — ABNORMAL LOW (ref >90)
GFR calc non Af Amer: 52 mL/min — ABNORMAL LOW (ref >90)
Glucose, Bld: 95 mg/dL (ref 70–99)
Total Bilirubin: 0.5 mg/dL (ref 0.3–1.2)

## 2012-06-29 LAB — SURGICAL PCR SCREEN: Staphylococcus aureus: NEGATIVE

## 2012-06-29 NOTE — Patient Instructions (Addendum)
   Your procedure is scheduled on: Thursday, Dec 26  Enter through the [employer] of [facility] at: 6 am Pick up the phone at the desk and dial [phone] and inform us of your arrival.  Please call this number if you have any problems the morning of surgery: [phone]  Remember: Do not eat food after midnight: Wednesday Do not drink clear liquids after: Wednesday Take these medicines the morning of surgery with a SIP OF WATER: Lotensin/HCTZ, Flonase if needed.  Do not wear jewelry, make-up, or FINGER nail polish No metal in your hair or on your body. Do not wear lotions, powders, perfumes. You may wear deodorant.  Please use your CHG wash as directed prior to surgery.  Do not shave anywhere for at least 12 hours prior to first CHG shower.  Do not bring valuables to the hospital. Contacts, dentures or bridgework may not be worn into surgery.  Leave suitcase in the car. After Surgery it may be brought to your room. For patients being admitted to the hospital, checkout time is 11:00am the day of discharge. Home with daughter Elnita Maxwell.

## 2012-07-04 NOTE — H&P (Signed)
Sara Baker, Sara Baker NO.:  [id number]  MEDICAL RECORD NO.:  [id number]  LOCATION:  [facility]                           FACILITY:  [facility]  PHYSICIAN:  Malachi Pro. Ambrose Mantle, M.D. DATE OF BIRTH:  [birth year]  DATE OF ADMISSION:  06/29/2012 DATE OF DISCHARGE:  06/29/2012                             HISTORY & PHYSICAL   PRESENT ILLNESS:  This is a 71 year old black female, para 5-0-0-5, who had postmenopausal bleeding in February, 2013, came to the office, underwent a Pipelle an endometrial biopsy.  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 She underwent a D and C in June 2013, that showed atypical complex endometrial hyperplasia and fragments of benign endometrial polyps. Endometrial curettage showed atypical complex endometrial hyperplasia. The patient was advised hysterectomy and removal of tubes and ovaries. However, she declined that advice sought a second opinion from Dr. Lesleigh Noe, who concurred with the recommendation.  The patient came back for examination.  I repeated the endometrial biopsy with findings of simple and complex hyperplasia with focal cytologic atypia.  No carcinoma was identified.  The patient was scheduled for surgery at her request and is now admitted for the hysterectomy and removal of tubes and ovaries.  PAST MEDICAL HISTORY:  No known drug allergies.  She has had high blood pressure, colitis, and anxiety.  SURGICAL HISTORY:  Tubal ligation, breast mass, parathyroidectomy, and cataracts on both eyes.  MEDICATIONS:  Benazepril and hydrochlorothiazide 20/25 one a day.  FAMILY HISTORY:  Mother died at 75 of high blood pressure and a stroke. Father died at 51 of high blood pressure and stroke.  Six brothers and 6 sisters, only 2 sisters remain.  There is a combination of illnesses including alcoholism, cancer, lymphoma, and myocardial infarction.  SOCIAL HISTORY:  The patient does not drink,  smoke, or take drugs.  She had 5 vaginal deliveries.  PHYSICAL EXAMINATION:  VITAL SIGNS:  Blood pressure is 140/88, pulse is 80. HEAD, EYES, NOSE, AND THROAT:  Negative. NECK:  Supple without thyromegaly. HEART:  Normal size and sounds.  No murmurs. LUNGS:  Clear to auscultation. ABDOMEN:  Soft, nontender.  No masses are palpable.  Vulva and vagina are clean.  The cervix is clean.  The uterus is mid plane, upper limit of normal size.  Adnexa clear.  ADMITTING IMPRESSION:  Atypical complex endometrial hyperplasia.  The patient is admitted for abdominal hysterectomy and bilateral salpingo- oophorectomy.  She has been informed of the risks of surgery including, but not limited to heart attack, stroke, pulmonary embolus, wound disruption, hemorrhage with need for reoperation, and/or transfusion, fistula formation.  She understands the risks of surgery and is ready to proceed.     Malachi Pro. Ambrose Mantle, M.D.     TFH/MEDQ  D:  07/04/2012  T:  07/04/2012  Job:  [id number]

## 2012-07-05 ENCOUNTER — Encounter (HOSPITAL_COMMUNITY): Payer: Self-pay | Admitting: Anesthesiology

## 2012-07-05 ENCOUNTER — Encounter (HOSPITAL_COMMUNITY)
Admission: RE | Disposition: A | Discharge status: Home / Self Care | Payer: Self-pay | Source: Ambulatory Visit | Attending: Obstetrics and Gynecology

## 2012-07-05 ENCOUNTER — Observation Stay (HOSPITAL_COMMUNITY)
Admission: RE | Admit: 2012-07-05 | Discharge: 2012-07-07 | Disposition: A | Discharge status: Home / Self Care | Payer: BC Managed Care – PPO | Source: Ambulatory Visit | Attending: Obstetrics and Gynecology | Admitting: Obstetrics and Gynecology

## 2012-07-05 ENCOUNTER — Inpatient Hospital Stay (HOSPITAL_COMMUNITY): Payer: BC Managed Care – PPO | Admitting: Anesthesiology

## 2012-07-05 DIAGNOSIS — N8502 Endometrial intraepithelial neoplasia [EIN]: Principal | ICD-10-CM | POA: Insufficient documentation

## 2012-07-05 DIAGNOSIS — Z01818 Encounter for other preprocedural examination: Secondary | ICD-10-CM | POA: Insufficient documentation

## 2012-07-05 DIAGNOSIS — K8020 Calculus of gallbladder without cholecystitis without obstruction: Secondary | ICD-10-CM | POA: Insufficient documentation

## 2012-07-05 DIAGNOSIS — N8000 Endometriosis of the uterus, unspecified: Secondary | ICD-10-CM | POA: Insufficient documentation

## 2012-07-05 DIAGNOSIS — D250 Submucous leiomyoma of uterus: Secondary | ICD-10-CM | POA: Insufficient documentation

## 2012-07-05 DIAGNOSIS — K219 Gastro-esophageal reflux disease without esophagitis: Secondary | ICD-10-CM | POA: Diagnosis not present

## 2012-07-05 DIAGNOSIS — I10 Essential (primary) hypertension: Secondary | ICD-10-CM | POA: Diagnosis not present

## 2012-07-05 DIAGNOSIS — R000 Tachycardia, unspecified: Secondary | ICD-10-CM | POA: Insufficient documentation

## 2012-07-05 DIAGNOSIS — N840 Polyp of corpus uteri: Secondary | ICD-10-CM | POA: Insufficient documentation

## 2012-07-05 DIAGNOSIS — D252 Subserosal leiomyoma of uterus: Secondary | ICD-10-CM | POA: Insufficient documentation

## 2012-07-05 DIAGNOSIS — D251 Intramural leiomyoma of uterus: Secondary | ICD-10-CM | POA: Insufficient documentation

## 2012-07-05 DIAGNOSIS — N838 Other noninflammatory disorders of ovary, fallopian tube and broad ligament: Secondary | ICD-10-CM | POA: Insufficient documentation

## 2012-07-05 DIAGNOSIS — Z01812 Encounter for preprocedural laboratory examination: Secondary | ICD-10-CM | POA: Insufficient documentation

## 2012-07-05 DIAGNOSIS — N8500 Endometrial hyperplasia, unspecified: Secondary | ICD-10-CM | POA: Diagnosis not present

## 2012-07-05 HISTORY — PX: ABDOMINAL HYSTERECTOMY: SHX81

## 2012-07-05 HISTORY — PX: SALPINGOOPHORECTOMY: SHX82

## 2012-07-05 LAB — CBC
HCT: 38.2 % (ref 36.0–46.0)
Hemoglobin: 12.5 g/dL (ref 12.0–15.0)
RDW: 14.9 % (ref 11.5–15.5)
WBC: 11.8 10*3/uL — ABNORMAL HIGH (ref 4.0–10.5)

## 2012-07-05 LAB — CBC WITH DIFFERENTIAL/PLATELET
HCT: 36.9 % (ref 36.0–46.0)
Hemoglobin: 12.2 g/dL (ref 12.0–15.0)
Lymphocytes Relative: 3 % — ABNORMAL LOW (ref 12–46)
Lymphs Abs: 0.4 10*3/uL — ABNORMAL LOW (ref 0.7–4.0)
Monocytes Relative: 2 % — ABNORMAL LOW (ref 3–12)
Neutro Abs: 11.4 10*3/uL — ABNORMAL HIGH (ref 1.7–7.7)
Neutrophils Relative %: 95 % — ABNORMAL HIGH (ref 43–77)
RBC: 4.45 MIL/uL (ref 3.87–5.11)

## 2012-07-05 LAB — URINALYSIS, ROUTINE W REFLEX MICROSCOPIC
Bilirubin Urine: NEGATIVE
Glucose, UA: NEGATIVE mg/dL
Ketones, ur: NEGATIVE mg/dL
pH: 7.5 (ref 5.0–8.0)

## 2012-07-05 LAB — CREATININE, SERUM
Creatinine, Ser: 0.96 mg/dL (ref 0.50–1.10)
GFR calc Af Amer: 67 mL/min — ABNORMAL LOW (ref >90)
GFR calc non Af Amer: 58 mL/min — ABNORMAL LOW (ref >90)

## 2012-07-05 SURGERY — HYSTERECTOMY, ABDOMINAL
Anesthesia: General | Site: Abdomen | Wound class: Clean Contaminated

## 2012-07-05 MED ORDER — PHENYLEPHRINE 40 MCG/ML (10ML) SYRINGE FOR IV PUSH (FOR BLOOD PRESSURE SUPPORT)
PREFILLED_SYRINGE | INTRAVENOUS | Status: AC
  Filled 2012-07-05: qty 5

## 2012-07-05 MED ORDER — OXYCODONE-ACETAMINOPHEN 5-325 MG PO TABS
1.0000 | ORAL_TABLET | Freq: Four times a day (QID) | ORAL | Status: DC | PRN
  Administered 2012-07-06: 1 via ORAL
  Administered 2012-07-07: 0.5 via ORAL
  Filled 2012-07-05 (×2): qty 1

## 2012-07-05 MED ORDER — MIDAZOLAM HCL 5 MG/5ML IJ SOLN
INTRAMUSCULAR | Status: DC | PRN
  Administered 2012-07-05: 1 mg via INTRAVENOUS

## 2012-07-05 MED ORDER — HYDROMORPHONE HCL PF 1 MG/ML IJ SOLN
INTRAMUSCULAR | Status: AC
  Filled 2012-07-05: qty 1

## 2012-07-05 MED ORDER — ONDANSETRON HCL 4 MG/2ML IJ SOLN
4.0000 mg | Freq: Four times a day (QID) | INTRAMUSCULAR | Status: DC | PRN
  Administered 2012-07-05: 4 mg via INTRAVENOUS

## 2012-07-05 MED ORDER — POTASSIUM CHLORIDE ER 10 MEQ PO TBCR
10.0000 meq | EXTENDED_RELEASE_TABLET | Freq: Every day | ORAL | Status: DC
  Administered 2012-07-06: 10 meq via ORAL
  Filled 2012-07-05 (×3): qty 1

## 2012-07-05 MED ORDER — SODIUM CHLORIDE 0.9 % IJ SOLN: 9.0000 mL | INTRAMUSCULAR | Status: DC | PRN

## 2012-07-05 MED ORDER — PROPOFOL 10 MG/ML IV EMUL
INTRAVENOUS | Status: AC
Start: 2012-07-05 — End: 2012-07-05
  Filled 2012-07-05: qty 20

## 2012-07-05 MED ORDER — GLYCOPYRROLATE 0.2 MG/ML IJ SOLN
INTRAMUSCULAR | Status: DC | PRN
  Administered 2012-07-05: 0.1 mg via INTRAVENOUS
  Administered 2012-07-05: .7 mg via INTRAVENOUS

## 2012-07-05 MED ORDER — HEPARIN SODIUM (PORCINE) 5000 UNIT/ML IJ SOLN
INTRAMUSCULAR | Status: AC
  Administered 2012-07-05: 5000 [IU] via SUBCUTANEOUS
  Filled 2012-07-05: qty 1

## 2012-07-05 MED ORDER — HEPARIN SODIUM (PORCINE) 5000 UNIT/ML IJ SOLN
5000.0000 [IU] | Freq: Three times a day (TID) | INTRAMUSCULAR | Status: DC
Start: 2012-07-05 — End: 2012-07-07
  Administered 2012-07-05 – 2012-07-07 (×5): 5000 [IU] via SUBCUTANEOUS
  Filled 2012-07-05 (×6): qty 1

## 2012-07-05 MED ORDER — NEOSTIGMINE METHYLSULFATE 1 MG/ML IJ SOLN
INTRAMUSCULAR | Status: AC
  Filled 2012-07-05: qty 1

## 2012-07-05 MED ORDER — HYDROCHLOROTHIAZIDE 25 MG PO TABS
25.0000 mg | ORAL_TABLET | Freq: Every day | ORAL | Status: DC
  Administered 2012-07-06: 25 mg via ORAL
  Filled 2012-07-05 (×2): qty 1

## 2012-07-05 MED ORDER — CEFAZOLIN SODIUM-DEXTROSE 2-3 GM-% IV SOLR
2.0000 g | INTRAVENOUS | Status: AC
  Administered 2012-07-05: 2 g via INTRAVENOUS

## 2012-07-05 MED ORDER — CEFAZOLIN SODIUM-DEXTROSE 2-3 GM-% IV SOLR
INTRAVENOUS | Status: AC
  Filled 2012-07-05: qty 50

## 2012-07-05 MED ORDER — DEXTROSE IN LACTATED RINGERS 5 % IV SOLN: INTRAVENOUS | Status: DC

## 2012-07-05 MED ORDER — HEPARIN SODIUM (PORCINE) 5000 UNIT/ML IJ SOLN
5000.0000 [IU] | INTRAMUSCULAR | Status: AC
  Administered 2012-07-05: 5000 [IU] via SUBCUTANEOUS

## 2012-07-05 MED ORDER — ONDANSETRON HCL 4 MG/2ML IJ SOLN
INTRAMUSCULAR | Status: AC
  Filled 2012-07-05: qty 2

## 2012-07-05 MED ORDER — CALCIUM-MAGNESIUM-ZINC 1000-400-15 MG PO TABS: 1.0000 | ORAL_TABLET | Freq: Every day | ORAL | Status: DC

## 2012-07-05 MED ORDER — FENTANYL CITRATE 0.05 MG/ML IJ SOLN
25.0000 ug | INTRAMUSCULAR | Status: DC | PRN
  Administered 2012-07-05 (×2): 50 ug via INTRAVENOUS

## 2012-07-05 MED ORDER — SODIUM CHLORIDE 0.9 % IV SOLN
INTRAVENOUS | Status: DC
  Filled 2012-07-05: qty 0.4

## 2012-07-05 MED ORDER — BENAZEPRIL-HYDROCHLOROTHIAZIDE 20-25 MG PO TABS: 1.0000 | ORAL_TABLET | Freq: Every day | ORAL | Status: DC

## 2012-07-05 MED ORDER — FENTANYL CITRATE 0.05 MG/ML IJ SOLN
INTRAMUSCULAR | Status: AC
  Administered 2012-07-05: 50 ug via INTRAVENOUS
  Filled 2012-07-05: qty 2

## 2012-07-05 MED ORDER — FENTANYL CITRATE 0.05 MG/ML IJ SOLN
INTRAMUSCULAR | Status: AC
  Filled 2012-07-05: qty 5

## 2012-07-05 MED ORDER — BENAZEPRIL HCL 20 MG PO TABS
20.0000 mg | ORAL_TABLET | Freq: Every day | ORAL | Status: DC
  Administered 2012-07-06: 20 mg via ORAL
  Filled 2012-07-05 (×2): qty 1

## 2012-07-05 MED ORDER — MORPHINE SULFATE (PF) 1 MG/ML IV SOLN
INTRAVENOUS | Status: DC
  Administered 2012-07-05: 1 mg via INTRAVENOUS
  Administered 2012-07-05: 12:00:00 via INTRAVENOUS
  Filled 2012-07-05: qty 25

## 2012-07-05 MED ORDER — HYDROMORPHONE HCL PF 1 MG/ML IJ SOLN
INTRAMUSCULAR | Status: DC | PRN
  Administered 2012-07-05 (×2): 0.5 mg via INTRAVENOUS

## 2012-07-05 MED ORDER — LACTATED RINGERS IV SOLN
INTRAVENOUS | Status: DC
  Administered 2012-07-05 – 2012-07-06 (×2): via INTRAVENOUS

## 2012-07-05 MED ORDER — LIDOCAINE HCL (CARDIAC) 20 MG/ML IV SOLN
INTRAVENOUS | Status: DC | PRN
  Administered 2012-07-05: 60 mg via INTRAVENOUS

## 2012-07-05 MED ORDER — ONDANSETRON HCL 4 MG/2ML IJ SOLN
4.0000 mg | Freq: Four times a day (QID) | INTRAMUSCULAR | Status: DC | PRN
  Filled 2012-07-05: qty 2

## 2012-07-05 MED ORDER — GLYCOPYRROLATE 0.2 MG/ML IJ SOLN
INTRAMUSCULAR | Status: AC
  Filled 2012-07-05: qty 1

## 2012-07-05 MED ORDER — DIPHENHYDRAMINE HCL 50 MG/ML IJ SOLN: 12.5000 mg | Freq: Four times a day (QID) | INTRAMUSCULAR | Status: DC | PRN

## 2012-07-05 MED ORDER — ONDANSETRON HCL 4 MG PO TABS: 4.0000 mg | ORAL_TABLET | Freq: Four times a day (QID) | ORAL | Status: DC | PRN

## 2012-07-05 MED ORDER — PHENYLEPHRINE HCL 10 MG/ML IJ SOLN
INTRAMUSCULAR | Status: DC | PRN
  Administered 2012-07-05: 40 ug via INTRAVENOUS

## 2012-07-05 MED ORDER — ALBUTEROL SULFATE HFA 108 (90 BASE) MCG/ACT IN AERS: 2.0000 | INHALATION_SPRAY | RESPIRATORY_TRACT | Status: DC | PRN

## 2012-07-05 MED ORDER — NEOSTIGMINE METHYLSULFATE 1 MG/ML IJ SOLN
INTRAMUSCULAR | Status: DC | PRN
  Administered 2012-07-05: 4 mg via INTRAVENOUS

## 2012-07-05 MED ORDER — MIDAZOLAM HCL 2 MG/2ML IJ SOLN
INTRAMUSCULAR | Status: AC
  Filled 2012-07-05: qty 2

## 2012-07-05 MED ORDER — CEFAZOLIN SODIUM 1-5 GM-% IV SOLN
1.0000 g | Freq: Three times a day (TID) | INTRAVENOUS | Status: AC
  Administered 2012-07-05 (×2): 1 g via INTRAVENOUS
  Filled 2012-07-05 (×2): qty 50

## 2012-07-05 MED ORDER — NALOXONE HCL 0.4 MG/ML IJ SOLN: 0.4000 mg | INTRAMUSCULAR | Status: DC | PRN

## 2012-07-05 MED ORDER — DEXAMETHASONE SODIUM PHOSPHATE 10 MG/ML IJ SOLN
INTRAMUSCULAR | Status: AC
  Filled 2012-07-05: qty 1

## 2012-07-05 MED ORDER — DIPHENHYDRAMINE HCL 12.5 MG/5ML PO ELIX: 12.5000 mg | ORAL_SOLUTION | Freq: Four times a day (QID) | ORAL | Status: DC | PRN

## 2012-07-05 MED ORDER — SUCCINYLCHOLINE CHLORIDE 20 MG/ML IJ SOLN
INTRAMUSCULAR | Status: AC
  Filled 2012-07-05: qty 10

## 2012-07-05 MED ORDER — PROPOFOL 10 MG/ML IV BOLUS
INTRAVENOUS | Status: DC | PRN
  Administered 2012-07-05: 150 mg via INTRAVENOUS
  Administered 2012-07-05 (×3): 20 mg via INTRAVENOUS

## 2012-07-05 MED ORDER — GLYCOPYRROLATE 0.2 MG/ML IJ SOLN
INTRAMUSCULAR | Status: AC
  Filled 2012-07-05: qty 3

## 2012-07-05 MED ORDER — ROCURONIUM BROMIDE 50 MG/5ML IV SOLN
INTRAVENOUS | Status: AC
  Filled 2012-07-05: qty 1

## 2012-07-05 MED ORDER — ROCURONIUM BROMIDE 100 MG/10ML IV SOLN
INTRAVENOUS | Status: DC | PRN
  Administered 2012-07-05: 5 mg via INTRAVENOUS

## 2012-07-05 MED ORDER — AZITHROMYCIN 1 G PO PACK
PACK | ORAL | Status: AC
  Filled 2012-07-05: qty 1

## 2012-07-05 MED ORDER — LIDOCAINE HCL (CARDIAC) 20 MG/ML IV SOLN
INTRAVENOUS | Status: AC
Start: 2012-07-05 — End: 2012-07-05
  Filled 2012-07-05: qty 5

## 2012-07-05 MED ORDER — DEXAMETHASONE SODIUM PHOSPHATE 10 MG/ML IJ SOLN
INTRAMUSCULAR | Status: DC | PRN
  Administered 2012-07-05: 10 mg via INTRAVENOUS

## 2012-07-05 MED ORDER — FENTANYL CITRATE 0.05 MG/ML IJ SOLN
INTRAMUSCULAR | Status: DC | PRN
  Administered 2012-07-05 (×3): 50 ug via INTRAVENOUS
  Administered 2012-07-05: 100 ug via INTRAVENOUS

## 2012-07-05 MED ORDER — LACTATED RINGERS IV SOLN
INTRAVENOUS | Status: DC
  Administered 2012-07-05 (×3): via INTRAVENOUS

## 2012-07-05 MED ORDER — FLUTICASONE PROPIONATE 50 MCG/ACT NA SUSP
2.0000 | Freq: Every day | NASAL | Status: DC
  Filled 2012-07-05: qty 16

## 2012-07-05 MED ORDER — ONDANSETRON HCL 4 MG/2ML IJ SOLN
INTRAMUSCULAR | Status: DC | PRN
  Administered 2012-07-05: 4 mg via INTRAVENOUS

## 2012-07-05 MED ORDER — PHENYLEPHRINE HCL 10 MG/ML IJ SOLN
INTRAMUSCULAR | Status: DC | PRN
  Administered 2012-07-05: 08:00:00

## 2012-07-05 SURGICAL SUPPLY — 60 items
BAG DECANTER FOR FLEXI CONT (MISCELLANEOUS) ×4 IMPLANT
BLADE SURG 10 STRL SS (BLADE) ×4 IMPLANT
BLADE SURG 11 STRL SS (BLADE) ×4 IMPLANT
BLADE SURG 15 STRL LF C SS BP (BLADE) ×6 IMPLANT
BLADE SURG 15 STRL SS (BLADE) ×2
CABLE HIGH FREQUENCY MONO STRZ (ELECTRODE) IMPLANT
CANISTER SUCTION 2500CC (MISCELLANEOUS) ×8 IMPLANT
CATH ROBINSON RED A/P 16FR (CATHETERS) ×4 IMPLANT
CLOTH BEACON ORANGE TIMEOUT ST (SAFETY) ×4 IMPLANT
CONT PATH 16OZ SNAP LID 3702 (MISCELLANEOUS) ×4 IMPLANT
COVER TABLE BACK 60X90 (DRAPES) ×4 IMPLANT
DECANTER SPIKE VIAL GLASS SM (MISCELLANEOUS) IMPLANT
DRAPE LAPAROTOMY TRNSV 102X78 (DRAPE) ×4 IMPLANT
DRSG COVADERM 4X10 (GAUZE/BANDAGES/DRESSINGS) ×4 IMPLANT
DRSG VASELINE 3X18 (GAUZE/BANDAGES/DRESSINGS) IMPLANT
ELECT REM PT RETURN 9FT ADLT (ELECTROSURGICAL) ×4
ELECTRODE REM PT RTRN 9FT ADLT (ELECTROSURGICAL) ×3 IMPLANT
EVACUATOR SMOKE 8.L (FILTER) IMPLANT
FORCEPS CUTTING 33CM 5MM (CUTTING FORCEPS) IMPLANT
GAUZE SPONGE 4X4 12PLY STRL LF (GAUZE/BANDAGES/DRESSINGS) ×4 IMPLANT
GAUZE VASELINE 3X9 (GAUZE/BANDAGES/DRESSINGS) ×4 IMPLANT
GLOVE BIO SURGEON STRL SZ7.5 (GLOVE) ×12 IMPLANT
GOWN PREVENTION PLUS LG XLONG (DISPOSABLE) ×8 IMPLANT
GOWN PREVENTION PLUS XLARGE (GOWN DISPOSABLE) ×8 IMPLANT
GOWN STRL REIN XL XLG (GOWN DISPOSABLE) ×16 IMPLANT
NS IRRIG 1000ML POUR BTL (IV SOLUTION) ×4 IMPLANT
PACK ABDOMINAL GYN (CUSTOM PROCEDURE TRAY) ×4 IMPLANT
PACK LAVH (CUSTOM PROCEDURE TRAY) ×4 IMPLANT
PAD OB MATERNITY 4.3X12.25 (PERSONAL CARE ITEMS) ×4 IMPLANT
PROTECTOR NERVE ULNAR (MISCELLANEOUS) ×8 IMPLANT
RETRACTOR WND ALEXIS 25 LRG (MISCELLANEOUS) ×3 IMPLANT
RTRCTR WOUND ALEXIS 25CM LRG (MISCELLANEOUS) ×4
SET IRRIG TUBING LAPAROSCOPIC (IRRIGATION / IRRIGATOR) IMPLANT
SOLUTION ELECTROLUBE (MISCELLANEOUS) ×4 IMPLANT
SPONGE LAP 18X18 X RAY DECT (DISPOSABLE) ×12 IMPLANT
SPONGE LAP 4X18 X RAY DECT (DISPOSABLE) ×4 IMPLANT
STAPLER VISISTAT 35W (STAPLE) ×4 IMPLANT
STRIP CLOSURE SKIN 1/4X3 (GAUZE/BANDAGES/DRESSINGS) IMPLANT
SUT PDS AB 0 CTX 60 (SUTURE) IMPLANT
SUT PLAIN 3 0 FS2 (SUTURE) IMPLANT
SUT PROLENE 0 CT 1 30 (SUTURE) IMPLANT
SUT VIC AB 0 CT1 18XCR BRD8 (SUTURE) ×6 IMPLANT
SUT VIC AB 0 CT1 27 (SUTURE) ×4
SUT VIC AB 0 CT1 27XBRD ANBCTR (SUTURE) ×12 IMPLANT
SUT VIC AB 0 CT1 36 (SUTURE) ×8 IMPLANT
SUT VIC AB 0 CT1 8-18 (SUTURE) ×2
SUT VIC AB 2-0 UR6 27 (SUTURE) IMPLANT
SUT VIC AB 3-0 CTX 36 (SUTURE) ×4 IMPLANT
SUT VIC AB 3-0 SH 27 (SUTURE) ×1
SUT VIC AB 3-0 SH 27X BRD (SUTURE) ×3 IMPLANT
SUT VICRYL 0 TIES 12 18 (SUTURE) ×4 IMPLANT
SUT VICRYL 0 UR6 27IN ABS (SUTURE) IMPLANT
TOWEL OR 17X24 6PK STRL BLUE (TOWEL DISPOSABLE) ×12 IMPLANT
TRAY FOLEY CATH 14FR (SET/KITS/TRAYS/PACK) ×4 IMPLANT
TROCAR BALLN 12MMX100 BLUNT (TROCAR) IMPLANT
TROCAR XCEL NON-BLD 5MMX100MML (ENDOMECHANICALS) IMPLANT
TROCAR Z-THREAD FIOS 11X100 BL (TROCAR) IMPLANT
TUBING SUCTION BULK 100 FT (MISCELLANEOUS) ×4 IMPLANT
WATER STERILE IRR 1000ML POUR (IV SOLUTION) ×4 IMPLANT
YANKAUER SUCT BULB TIP NO VENT (SUCTIONS) ×4 IMPLANT

## 2012-07-05 NOTE — Anesthesia Procedure Notes (Signed)
Procedure Name: Intubation Date/Time: 07/05/2012 7:45 AM Performed by: Graciela Husbands Pre-anesthesia Checklist: Suction available, Patient being monitored, Emergency Drugs available, Patient identified and Timeout performed Patient Re-evaluated:Patient Re-evaluated prior to inductionOxygen Delivery Method: Circle system utilized Preoxygenation: Pre-oxygenation with 100% oxygen Intubation Type: IV induction Ventilation: Mask ventilation without difficulty and Oral airway inserted - appropriate to patient size Laryngoscope Size: Mac and 3 Grade View: Grade I Tube type: Oral Tube size: 7.0 mm Number of attempts: 1 Airway Equipment and Method: Stylet Placement Confirmation: ETT inserted through vocal cords under direct vision,  positive ETCO2 and breath sounds checked- equal and bilateral Secured at: 20 cm Tube secured with: Tape Dental Injury: Teeth and Oropharynx as per pre-operative assessment

## 2012-07-05 NOTE — Anesthesia Postprocedure Evaluation (Signed)
Anesthesia Post Note  Patient: Sara Baker  Procedure(s) Performed: Procedure(s) (LRB): HYSTERECTOMY ABDOMINAL (N/A) SALPINGO OOPHORECTOMY (Bilateral)  Anesthesia type: General  Patient location: PACU  Post pain: Pain level controlled  Post assessment: Post-op Vital signs reviewed  Last Vitals:  Filed Vitals:   07/05/12 1015  BP: 172/84  Pulse: 85  Temp:   Resp: 10    Post vital signs: Reviewed  Level of consciousness: sedated  Complications: No apparent anesthesia complicationsfj

## 2012-07-05 NOTE — Addendum Note (Signed)
Addendum  created 07/05/12 1838 by Shanon Payor, CRNA   Modules edited:Notes Section

## 2012-07-05 NOTE — Progress Notes (Signed)
Patient ID: Sara Baker, female   DOB: [birth year], 71 y.o.   MRN: [id number] I examined this pt 1 week ago and she reports no major change in her health since that time.

## 2012-07-05 NOTE — Anesthesia Postprocedure Evaluation (Signed)
  Anesthesia Post-op Note  Patient: Sara Baker  Procedure(s) Performed: Procedure(s) (LRB) with comments: HYSTERECTOMY ABDOMINAL (N/A) SALPINGO OOPHORECTOMY (Bilateral)  Patient Location: Women's unit  Anesthesia Type:General  Level of Consciousness: awake, alert  and oriented  Airway and Oxygen Therapy: Patient Spontanous Breathing and Patient connected to nasal cannula oxygen  Post-op Pain: none  Post-op Assessment: Post-op Vital signs reviewed and Patient's Cardiovascular Status Stable  Post-op Vital Signs: Reviewed and stable  Complications: No apparent anesthesia complications  Anesthesia Post-op Note  Patient: Sara Baker  Procedure(s) Performed: Procedure(s) (LRB) with comments: HYSTERECTOMY ABDOMINAL (N/A) SALPINGO OOPHORECTOMY (Bilateral)  Patient Location: Women's unit  Anesthesia Type:General  Level of Consciousness: awake, alert  and oriented  Airway and Oxygen Therapy: Patient Spontanous Breathing and Patient connected to nasal cannula oxygen  Post-op Pain: none  Post-op Assessment: Post-op Vital signs reviewed and Patient's Cardiovascular Status Stable  Post-op Vital Signs: Reviewed and stable  Complications: No apparent anesthesia complications  Anesthesia Post-op Note  Patient: Sara Baker  Procedure(s) Performed: Procedure(s) (LRB) with comments: HYSTERECTOMY ABDOMINAL (N/A) SALPINGO OOPHORECTOMY (Bilateral)  Patient Location: Women's unit  Anesthesia Type:General  Level of Consciousness: awake, alert  and oriented  Airway and Oxygen Therapy: Patient Spontanous Breathing and Patient connected to nasal cannula oxygen  Post-op Pain: none  Post-op Assessment: Post-op Vital signs reviewed and Patient's Cardiovascular Status Stable  Post-op Vital Signs: Reviewed and stable  Complications: No apparent anesthesia complications

## 2012-07-05 NOTE — Progress Notes (Signed)
Patient ID: Sara Baker, female   DOB: 12-Oct-1940, 71 y.o.   MRN: [id number] Pulse remains elevated but abdomen is soft and not tender. The urine is clear and light yellow The CBC was checked at 1PM and the HCT was higher than pre op. I will recheck the CBC because of the tachycardia even though there does not appear to be a hemodynamic problem.

## 2012-07-05 NOTE — Anesthesia Preprocedure Evaluation (Addendum)
Anesthesia Evaluation  Patient identified by MRN, date of birth, ID band Patient awake    Reviewed: Allergy & Precautions, H&P , Patient's Chart, lab work & pertinent test results, reviewed documented beta blocker date and time   Airway Mallampati: II TM Distance: >3 FB Neck ROM: full    Dental No notable dental hx. (+) Edentulous Upper   Pulmonary  breath sounds clear to auscultation  Pulmonary exam normal       Cardiovascular hypertension, Pt. on medications Rhythm:regular Rate:Normal     Neuro/Psych    GI/Hepatic   Endo/Other    Renal/GU      Musculoskeletal   Abdominal   Peds  Hematology   Anesthesia Other Findings   Reproductive/Obstetrics                          Anesthesia Physical Anesthesia Plan  ASA: II  Anesthesia Plan: General   Post-op Pain Management:    Induction: Intravenous  Airway Management Planned: Oral ETT  Additional Equipment:   Intra-op Plan:   Post-operative Plan:   Informed Consent: I have reviewed the patients History and Physical, chart, labs and discussed the procedure including the risks, benefits and alternatives for the proposed anesthesia with the patient or authorized representative who has indicated his/her understanding and acceptance.   Dental Advisory Given and Dental advisory given  Plan Discussed with: CRNA and Surgeon  Anesthesia Plan Comments: (  Discussed  general anesthesia, including possible nausea, instrumentation of airway, sore throat,pulmonary aspiration, etc. I asked if the were any outstanding questions, or  concerns before we proceeded. )        Anesthesia Quick Evaluation

## 2012-07-05 NOTE — Transfer of Care (Signed)
Immediate Anesthesia Transfer of Care Note  Patient: Sara Baker  Procedure(s) Performed: Procedure(s) (LRB) with comments: HYSTERECTOMY ABDOMINAL (N/A) SALPINGO OOPHORECTOMY (Bilateral)  Patient Location: PACU  Anesthesia Type:General  Level of Consciousness: awake, alert  and oriented  Airway & Oxygen Therapy: Patient Spontanous Breathing and Patient connected to nasal cannula oxygen  Post-op Assessment: Report given to PACU RN and Post -op Vital signs reviewed and stable  Post vital signs: Reviewed and stable  Complications: No apparent anesthesia complications

## 2012-07-05 NOTE — Progress Notes (Signed)
Patient ID: Sara Baker, female   DOB: [birth year], 70 y.o.   MRN: [id number] Pt is awake and alert. The output is good and the urine is clear. Her BP is normal but her pulse is elevated at 116. Pre op was 96. The abdomen is soft and not tender. The pt does not c/o pain Will observe.

## 2012-07-05 NOTE — Op Note (Signed)
NAMEJILLAYNE, WITTE NO.:  [id number]  MEDICAL RECORD NO.:  [id number]  LOCATION:  [facility]                          [facility]  PHYSICIAN:  Malachi Pro. Ambrose Mantle, M.D. DATE OF BIRTH:  05/27/1941  DATE OF PROCEDURE:  07/05/2012 DATE OF DISCHARGE:                              OPERATIVE REPORT   PREOPERATIVE DIAGNOSIS:  Endometrial hyperplasia with atypia.  POSTOPERATIVE DIAGNOSIS:  Endometrial hyperplasia with atypia. Cholelithiasis.  OPERATION:  Abdominal hysterectomy, bilateral salpingo-oophorectomy.  OPERATOR:  Malachi Pro. Ambrose Mantle, MD  ASSISTANT:  Huel Cote, MD  ANESTHESIA:  General anesthesia.  DESCRIPTION OF PROCEDURE:  The patient was brought to the operating room, placed under satisfactory general anesthesia, placed in a lithotomy position.  The abdomen, vulva, vagina, and urethra were prepped with Betadine solution.  The urethra was prepped and a Foley catheter was inserted to straight drain.  The patient was then placed supine on the table.  The abdomen was draped as a sterile field and a time-out was done.  The abdomen was then draped as a sterile field.  A transverse incision was made and carried in layers through the skin, subcutaneous tissue, and fascia suprapubically.  The fascia was then separated from the rectus muscles, superiorly and inferiorly.  Rectus muscle was split in the midline.  Peritoneum opened vertically. Exploration of the pelvis revealed the uterus to be upper limit of normal size, small fibroids, adnexa were free of masses.  Both tubes and ovaries were normal except for prior tubal ligation.  An Alexis retractor was placed and 4 lap tapes were used to prepare the operative field.  The uterus was clamped across its upper pedicles with long Kelly clamps.  Both round ligaments were identified and divided with the Bovie.  Bladder flap was developed.  The infundibulopelvic ligaments on both sides were clamped, cut, and suture  ligated and doubly ligated. Uterine vessels were skeletonized, clamped, cut, and suture ligated, parametrial and paracervical tissues clamped, cut, suture ligated, and the uterosacral ligaments were clamped, cut, suture ligated, and held. The bladder had been pushed inferior to the cervix.  The right vaginal angle was entered.  The left side of the cervix was removed by transecting the cervicovaginal junction.  A left vaginal angle was created.  The right vaginal angle suture was done and the central portion of the vagina was closed with several interrupted figure-of- eight sutures of 0 Vicryl.  Hemostasis was achieved with a combination of Bovie and suture.  Liberal irrigation confirmed hemostasis.  Both ureters appeared completely normal in their course and contour and diameter.  The uterosacral ligaments were sutured together in the midline.  Reperitonealization of the vaginal cuff was done.  The retractor and packs were removed.  The upper abdomen was explored.  I did feel stones in the gallbladder.  No other abnormalities were noted, except I did not feel the left kidney.  The abdominal wall was then closed with interrupted sutures of 0 Vicryl on the rectus muscle and peritoneum in 1 layer.  The fascia was closed with 2 running sutures of 0 Vicryl.  Subcutaneous tissue with a running 3-0 Vicryl, and the skin was closed  with automatic staples.  The patient's blood loss was probably no more than 200 mL.  Sponge and needle counts were correct and she was returned to recovery in satisfactory condition.     Malachi Pro. Ambrose Mantle, M.D.     TFH/MEDQ  D:  07/05/2012  T:  07/05/2012  Job:  [id number]

## 2012-07-06 ENCOUNTER — Encounter (HOSPITAL_COMMUNITY): Payer: Self-pay | Admitting: *Deleted

## 2012-07-06 LAB — CBC WITH DIFFERENTIAL/PLATELET
Basophils Absolute: 0 10*3/uL (ref 0.0–0.1)
Basophils Relative: 0 % (ref 0–1)
Hemoglobin: 11.8 g/dL — ABNORMAL LOW (ref 12.0–15.0)
Lymphocytes Relative: 7 % — ABNORMAL LOW (ref 12–46)
MCHC: 33.1 g/dL (ref 30.0–36.0)
Neutro Abs: 12.7 10*3/uL — ABNORMAL HIGH (ref 1.7–7.7)
Neutrophils Relative %: 85 % — ABNORMAL HIGH (ref 43–77)
RDW: 15.1 % (ref 11.5–15.5)
WBC: 14.9 10*3/uL — ABNORMAL HIGH (ref 4.0–10.5)

## 2012-07-06 NOTE — Progress Notes (Signed)
Patient ID: Sara Baker, female   DOB: [birth year], 71 y.o.   MRN: [id number] #1 afebrile BP slightly elevated Pulse still slightly high Will check TSH Output is good AND cbc IS ESSENTIALLY UNCHANGED

## 2012-07-07 LAB — T4, FREE: Free T4: 1.4 ng/dL (ref 0.80–1.80)

## 2012-07-07 LAB — T3, FREE: T3, Free: 2.3 pg/mL (ref 2.3–4.2)

## 2012-07-07 MED ORDER — IBUPROFEN 600 MG PO TABS: 600.0000 mg | ORAL_TABLET | Freq: Four times a day (QID) | ORAL | Status: DC | PRN

## 2012-07-07 MED ORDER — OXYCODONE-ACETAMINOPHEN 5-325 MG PO TABS: 1.0000 | ORAL_TABLET | Freq: Four times a day (QID) | ORAL | Status: DC | PRN

## 2012-07-07 NOTE — Discharge Summary (Signed)
NAMEJASSICA, [name] NO.:  [id number]  MEDICAL RECORD NO.:  [id number]  LOCATION:  9320                          [facility]  PHYSICIAN:  Malachi Pro. Ambrose Mantle, M.D. DATE OF BIRTH:  06/28/1941  DATE OF ADMISSION:  07/05/2012 DATE OF DISCHARGE:  07/07/2012                              DISCHARGE SUMMARY   This is a 71 year old black female admitted for abdominal hysterectomy, bilateral salpingo-oophorectomy because of endometrial hyperplasia with atypia.  The patient underwent the procedure on December 26, did well postoperatively and was discharged on the second postop day.  She had no nausea, vomiting.  She tolerated small amounts of liquids and solid food and was ready for discharge on the second postop day.  She did have a persistent mild tachycardia after surgery which I evaluated with TSH and the TSH was 0.629.  So I am going to do a T3 and T4 to follow up on that.  Other than the mild tachycardia, she has done quite well and is ready for discharge.  Her initial hemoglobin on admission was 12.5, hematocrit 38.2, white count 11800, platelet count 169,000.  Followup hemoglobins were 12.2 and 11.8.  The TSH was 0.629.  FINAL DIAGNOSIS:  Endometrial hyperplasia with atypia.  OPERATION:  Abdominal hysterectomy, bilateral salpingo-oophorectomy.  FINAL CONDITION:  Improved.  Instructions include our regular discharge instructions.  Call with any fever above 100.4 degrees, any heavy vaginal bleeding, or any unusual problems.  Call if unable to tolerate diet and return to the office in 2- 6 days to have her staples removed.  She is to resume all of her home medications and take Motrin 600 mg 1 every 6 hours as needed for pain and Percocet 5/325, 30 tablets, 1 every 6 hours as needed for pain.     Malachi Pro. Ambrose Mantle, M.D.     TFH/MEDQ  D:  07/07/2012  T:  07/07/2012  Job:  [id number]

## 2012-07-07 NOTE — Progress Notes (Addendum)
Discharge instructions given to patient at bedside.  Activity instructions, medications, follow up appointments and community resources discussed.  No questions at this time.  Patient discharged home with personal belongings in stable condition.  Patient left unit in stable condition accompanied by staff member.  Osvaldo Angst, RN-------------

## 2012-07-07 NOTE — Progress Notes (Signed)
Patient ID: Sara Baker, female   DOB: 04/18/1941, 71 y.o.   MRN: [id number] #2 afebrile Has tolerated some fluids for d/c. TSH suggests borderline high thyroid function. Will do T3 and T4 if it can be done on blood already drawn. If not will f/u in the office.

## 2012-08-20 DIAGNOSIS — R350 Frequency of micturition: Secondary | ICD-10-CM | POA: Diagnosis not present

## 2012-08-20 DIAGNOSIS — K8020 Calculus of gallbladder without cholecystitis without obstruction: Secondary | ICD-10-CM | POA: Diagnosis not present

## 2012-08-20 DIAGNOSIS — E039 Hypothyroidism, unspecified: Secondary | ICD-10-CM | POA: Diagnosis not present

## 2012-08-20 DIAGNOSIS — I10 Essential (primary) hypertension: Secondary | ICD-10-CM | POA: Diagnosis not present

## 2012-10-18 DIAGNOSIS — R51 Headache: Secondary | ICD-10-CM | POA: Diagnosis not present

## 2012-10-18 DIAGNOSIS — R509 Fever, unspecified: Secondary | ICD-10-CM | POA: Diagnosis not present

## 2012-10-18 DIAGNOSIS — J3489 Other specified disorders of nose and nasal sinuses: Secondary | ICD-10-CM | POA: Diagnosis not present

## 2012-12-18 DIAGNOSIS — Z961 Presence of intraocular lens: Secondary | ICD-10-CM | POA: Diagnosis not present

## 2012-12-18 DIAGNOSIS — H16109 Unspecified superficial keratitis, unspecified eye: Secondary | ICD-10-CM | POA: Diagnosis not present

## 2012-12-18 DIAGNOSIS — H04129 Dry eye syndrome of unspecified lacrimal gland: Secondary | ICD-10-CM | POA: Diagnosis not present

## 2013-03-29 DIAGNOSIS — J329 Chronic sinusitis, unspecified: Secondary | ICD-10-CM | POA: Diagnosis not present

## 2013-07-09 DIAGNOSIS — J069 Acute upper respiratory infection, unspecified: Secondary | ICD-10-CM | POA: Diagnosis not present

## 2013-07-09 DIAGNOSIS — R42 Dizziness and giddiness: Secondary | ICD-10-CM | POA: Diagnosis not present

## 2013-08-20 DIAGNOSIS — N951 Menopausal and female climacteric states: Secondary | ICD-10-CM | POA: Diagnosis not present

## 2013-08-20 DIAGNOSIS — Z01419 Encounter for gynecological examination (general) (routine) without abnormal findings: Secondary | ICD-10-CM | POA: Diagnosis not present

## 2013-08-20 DIAGNOSIS — N898 Other specified noninflammatory disorders of vagina: Secondary | ICD-10-CM | POA: Diagnosis not present

## 2013-08-20 DIAGNOSIS — Z0000 Encounter for general adult medical examination without abnormal findings: Secondary | ICD-10-CM | POA: Diagnosis not present

## 2014-01-28 DIAGNOSIS — J329 Chronic sinusitis, unspecified: Secondary | ICD-10-CM | POA: Diagnosis not present

## 2014-01-28 DIAGNOSIS — K219 Gastro-esophageal reflux disease without esophagitis: Secondary | ICD-10-CM | POA: Diagnosis not present

## 2014-04-09 DIAGNOSIS — I10 Essential (primary) hypertension: Secondary | ICD-10-CM | POA: Diagnosis not present

## 2014-04-09 DIAGNOSIS — R7309 Other abnormal glucose: Secondary | ICD-10-CM | POA: Diagnosis not present

## 2014-04-09 DIAGNOSIS — E039 Hypothyroidism, unspecified: Secondary | ICD-10-CM | POA: Diagnosis not present

## 2014-04-09 DIAGNOSIS — J329 Chronic sinusitis, unspecified: Secondary | ICD-10-CM | POA: Diagnosis not present

## 2014-04-09 DIAGNOSIS — J309 Allergic rhinitis, unspecified: Secondary | ICD-10-CM | POA: Diagnosis not present

## 2014-07-08 DIAGNOSIS — H40033 Anatomical narrow angle, bilateral: Secondary | ICD-10-CM | POA: Diagnosis not present

## 2014-07-08 DIAGNOSIS — H10413 Chronic giant papillary conjunctivitis, bilateral: Secondary | ICD-10-CM | POA: Diagnosis not present

## 2014-08-26 DIAGNOSIS — Z0000 Encounter for general adult medical examination without abnormal findings: Secondary | ICD-10-CM | POA: Diagnosis not present

## 2014-08-26 DIAGNOSIS — K429 Umbilical hernia without obstruction or gangrene: Secondary | ICD-10-CM | POA: Diagnosis not present

## 2014-08-26 DIAGNOSIS — Z01419 Encounter for gynecological examination (general) (routine) without abnormal findings: Secondary | ICD-10-CM | POA: Diagnosis not present

## 2014-08-26 DIAGNOSIS — Z124 Encounter for screening for malignant neoplasm of cervix: Secondary | ICD-10-CM | POA: Diagnosis not present

## 2014-08-27 DIAGNOSIS — Z124 Encounter for screening for malignant neoplasm of cervix: Secondary | ICD-10-CM | POA: Diagnosis not present

## 2014-10-09 DIAGNOSIS — M109 Gout, unspecified: Secondary | ICD-10-CM | POA: Diagnosis not present

## 2014-10-09 DIAGNOSIS — R739 Hyperglycemia, unspecified: Secondary | ICD-10-CM | POA: Diagnosis not present

## 2014-10-09 DIAGNOSIS — J309 Allergic rhinitis, unspecified: Secondary | ICD-10-CM | POA: Diagnosis not present

## 2014-10-09 DIAGNOSIS — I10 Essential (primary) hypertension: Secondary | ICD-10-CM | POA: Diagnosis not present

## 2014-10-09 DIAGNOSIS — K219 Gastro-esophageal reflux disease without esophagitis: Secondary | ICD-10-CM | POA: Diagnosis not present

## 2014-10-09 DIAGNOSIS — Z0000 Encounter for general adult medical examination without abnormal findings: Secondary | ICD-10-CM | POA: Diagnosis not present

## 2014-10-09 DIAGNOSIS — D351 Benign neoplasm of parathyroid gland: Secondary | ICD-10-CM | POA: Diagnosis not present

## 2014-10-09 DIAGNOSIS — E039 Hypothyroidism, unspecified: Secondary | ICD-10-CM | POA: Diagnosis not present

## 2014-11-18 DIAGNOSIS — R351 Nocturia: Secondary | ICD-10-CM | POA: Diagnosis not present

## 2014-11-18 DIAGNOSIS — I10 Essential (primary) hypertension: Secondary | ICD-10-CM | POA: Diagnosis not present

## 2014-11-18 DIAGNOSIS — R509 Fever, unspecified: Secondary | ICD-10-CM | POA: Diagnosis not present

## 2014-11-21 DIAGNOSIS — I10 Essential (primary) hypertension: Secondary | ICD-10-CM | POA: Diagnosis not present

## 2014-11-21 DIAGNOSIS — N189 Chronic kidney disease, unspecified: Secondary | ICD-10-CM | POA: Diagnosis not present

## 2014-11-21 DIAGNOSIS — E559 Vitamin D deficiency, unspecified: Secondary | ICD-10-CM | POA: Diagnosis not present

## 2014-11-21 DIAGNOSIS — E785 Hyperlipidemia, unspecified: Secondary | ICD-10-CM | POA: Diagnosis not present

## 2014-11-21 DIAGNOSIS — R5381 Other malaise: Secondary | ICD-10-CM | POA: Diagnosis not present

## 2014-12-30 DIAGNOSIS — Z79899 Other long term (current) drug therapy: Secondary | ICD-10-CM | POA: Diagnosis not present

## 2015-01-06 DIAGNOSIS — J320 Chronic maxillary sinusitis: Secondary | ICD-10-CM | POA: Diagnosis not present

## 2015-01-06 DIAGNOSIS — J322 Chronic ethmoidal sinusitis: Secondary | ICD-10-CM | POA: Diagnosis not present

## 2015-01-09 DIAGNOSIS — R5383 Other fatigue: Secondary | ICD-10-CM | POA: Diagnosis not present

## 2015-01-09 DIAGNOSIS — E039 Hypothyroidism, unspecified: Secondary | ICD-10-CM | POA: Diagnosis not present

## 2015-01-09 DIAGNOSIS — R739 Hyperglycemia, unspecified: Secondary | ICD-10-CM | POA: Diagnosis not present

## 2015-01-09 DIAGNOSIS — I10 Essential (primary) hypertension: Secondary | ICD-10-CM | POA: Diagnosis not present

## 2015-01-13 DIAGNOSIS — R7301 Impaired fasting glucose: Secondary | ICD-10-CM | POA: Diagnosis not present

## 2015-01-13 DIAGNOSIS — Z049 Encounter for examination and observation for unspecified reason: Secondary | ICD-10-CM | POA: Diagnosis not present

## 2015-01-13 DIAGNOSIS — Z79890 Hormone replacement therapy (postmenopausal): Secondary | ICD-10-CM | POA: Diagnosis not present

## 2015-01-13 DIAGNOSIS — R252 Cramp and spasm: Secondary | ICD-10-CM | POA: Diagnosis not present

## 2015-03-12 DIAGNOSIS — Z1231 Encounter for screening mammogram for malignant neoplasm of breast: Secondary | ICD-10-CM | POA: Diagnosis not present

## 2015-04-13 DIAGNOSIS — R05 Cough: Secondary | ICD-10-CM | POA: Diagnosis not present

## 2015-04-13 DIAGNOSIS — I10 Essential (primary) hypertension: Secondary | ICD-10-CM | POA: Diagnosis not present

## 2015-06-02 DIAGNOSIS — E785 Hyperlipidemia, unspecified: Secondary | ICD-10-CM | POA: Diagnosis not present

## 2015-06-02 DIAGNOSIS — I10 Essential (primary) hypertension: Secondary | ICD-10-CM | POA: Diagnosis not present

## 2015-06-02 DIAGNOSIS — R5381 Other malaise: Secondary | ICD-10-CM | POA: Diagnosis not present

## 2015-06-02 DIAGNOSIS — N189 Chronic kidney disease, unspecified: Secondary | ICD-10-CM | POA: Diagnosis not present

## 2015-06-24 DIAGNOSIS — H6690 Otitis media, unspecified, unspecified ear: Secondary | ICD-10-CM | POA: Diagnosis not present

## 2015-06-24 DIAGNOSIS — G2581 Restless legs syndrome: Secondary | ICD-10-CM | POA: Diagnosis not present

## 2015-08-03 ENCOUNTER — Encounter: Payer: Self-pay | Admitting: Gastroenterology

## 2016-05-04 ENCOUNTER — Ambulatory Visit (INDEPENDENT_AMBULATORY_CARE_PROVIDER_SITE_OTHER): Payer: Medicare Other | Admitting: Neurology

## 2016-05-04 ENCOUNTER — Encounter: Payer: Self-pay | Admitting: Neurology

## 2016-05-04 VITALS — BP 138/70 | HR 80 | Resp 20 | Ht 64.0 in | Wt 179.0 lb

## 2016-05-04 DIAGNOSIS — H8112 Benign paroxysmal vertigo, left ear: Secondary | ICD-10-CM

## 2016-05-04 NOTE — Progress Notes (Signed)
Provider:  Larey Seat, M D  Referring Provider: Kelton Pillar, MD Primary Care Physician:  Leonard Downing, MD  Chief Complaint  Patient presents with  . New Patient (Initial Visit)    dizziness, headaches, balance disturbances    HPI:  Sara Baker is a 75 y.o. female  Is seen here as a referral  from Dr. Arelia Sneddon for Dizziness.   Sara Baker reports that her main problem is feeling off balance at term she would prefer over dizziness. She has already seen an ear nose and throat specialist Dr. Ernesto Rutherford follows her for sinus problems and she had an MRI of the brain according to Dr. Claris Gower notes. This MRI was ordered 2 weeks ago. She had followed another primary care physician until spring of this year. She presented to Dr. Arelia Sneddon office with high blood pressure 170/103 mmHg , pulse rate of 97 and a temperature of 96.9 Fahrenheit. The high blood pressure is a rather new development over the last month or 2. Dr. Arelia Sneddon has placed her on amlodipine lovastatin, Flonase, Meclizine, [facility] on [address]. The MRI was reportedly normal , performed at [facility]. Regrettably, I  cannot view the images.   Sara Baker has noted that she drifts more towards the left, and she also gets more off balance when she rises from a seated or lying position. This seems to be an orthostatic component. She gets dizzy when she rapidly moves her head.  Review of Systems: Out of a complete 14 system review, the patient complains of only the following symptoms, and all other reviewed systems are negative. Off balance,, dizziness, HTN related headaches.   Social History   Social History  . Marital status: Widowed    Spouse name: N/A  . Number of children: N/A  . Years of education: N/A   Occupational History  . Not on file.   Social History Main Topics  . Smoking status: Never Smoker  . Smokeless tobacco: Never Used  . Alcohol use No  . Drug use: No  .  Sexual activity: Yes    Birth control/ protection: Surgical   Other Topics Concern  . Not on file   Social History Narrative  . No narrative on file    No family history on file.  Past Medical History:  Diagnosis Date  . Anemia   . Anxiety   . Arthritis    fingers, knee - otc meds prn  . Blood transfusion 10 + yrs   Wash DC  . Chronic sinusitis   . Colitis   . Cough    occasional - tx with albuterol inhaler  . GERD (gastroesophageal reflux disease)    diet controlled  . Hypertension   . Hypothyroidism   . SVD (spontaneous vaginal delivery)    x 5    Past Surgical History:  Procedure Laterality Date  . ABDOMINAL HYSTERECTOMY  07/05/2012   Procedure: HYSTERECTOMY ABDOMINAL;  Surgeon: Melina Schools, MD;  Location: [facility];  Service: Gynecology;  Laterality: N/A;  . BREAST SURGERY  1968   benign tumor  . CATARACT EXTRACTION W/ INTRAOCULAR LENS  IMPLANT, BILATERAL  2011  . DILATION AND CURETTAGE OF UTERUS  2013  . EYE SURGERY    . SALPINGOOPHORECTOMY  07/05/2012   Procedure: SALPINGO OOPHORECTOMY;  Surgeon: Melina Schools, MD;  Location: [facility];  Service: Gynecology;  Laterality: Bilateral;  . 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  TUBAL LIGATION  1982    Current Outpatient Prescriptions  Medication Sig Dispense Refill  . Calcium-Magnesium-Zinc 1000-400-15 MG TABS Take 1 tablet by mouth daily.    . Cholecalciferol (VITAMIN D3) 1000 UNITS CAPS Take 1,000 Units by mouth daily.    . Cyanocobalamin (VITAMIN B 12 PO) Take 1,000 mg by mouth daily.    . Flaxseed, Linseed, (FLAX SEED OIL PO) Take by mouth daily. Pt. Not sure of dose but maybe 400mg .    . fluticasone (FLONASE) 50 MCG/ACT nasal spray Place 2 sprays into both nostrils Daily.    Marland Kitchen ibuprofen (ADVIL,MOTRIN) 600 MG tablet Take 1 tablet (600 mg total) by mouth every 6 (six) hours as needed for pain. 30 tablet 0  . KLOR-CON 10 10 MEQ tablet Take 10 mEq by mouth daily.     Marland Kitchen losartan-hydrochlorothiazide  (HYZAAR) 100-25 MG tablet Take 1 tablet by mouth daily.    Marland Kitchen lovastatin (MEVACOR) 20 MG tablet Take 20 mg by mouth at bedtime.    Vladimir Faster Glycol-Propyl Glycol (SYSTANE FREE OP) Apply 1 drop to eye as needed. For Dry Eyes.    Marland Kitchen PROAIR HFA 108 (90 BASE) MCG/ACT inhaler Inhale 2 puffs into the lungs as needed. Shortness of breath and cough    . meclizine (ANTIVERT) 25 MG tablet Take 25 mg by mouth 3 (three) times daily as needed for dizziness.     No current facility-administered medications for this visit.     Allergies as of 05/04/2016  . (No Known Allergies)    Vitals: BP 138/70   Pulse 80   Resp 20   Ht 5\' 4"  (1.626 m)   Wt 179 lb (81.2 kg)   BMI 30.73 kg/m  Last Weight:  Wt Readings from Last 1 Encounters:  05/04/16 179 lb (81.2 kg)   Last Height:   Ht Readings from Last 1 Encounters:  05/04/16 5\' 4"  (1.626 m)    Physical exam:  General: The patient is awake, alert and appears not in acute distress. The patient is well groomed. She is phlegmatic, quiet and gives me a very limp hand.   Neck circumference is 14 inches, no retrognathia is noted. Head: Normocephalic, atraumatic. Neck is supple. Cardiovascular:  Regular rate and rhythm , without  murmurs or carotid bruit, and without distended neck veins. Respiratory: Lungs are clear to auscultation. Skin:  Without evidence of edema, or rash Trunk: BMI is  elevated and patient  has normal posture.  Neurologic exam : The patient is awake and alert, oriented to place and time.  Memory subjective  described as intact. There is a normal attention span & concentration ability. Speech is fluent without  dysarthria, dysphonia or aphasia. Mood and affect are depressed.   Cranial nerves: Pupils are equal and briskly reactive to light.  Funduscopic exam without  evidence of pallor or edema. Extraocular movements  in vertical and horizontal planes intact and without nystagmus.  Visual fields by finger perimetry are  intact. Hearing to finger rub intact.  Facial sensation intact to fine touch.  Facial motor strength is symmetric and tongue and uvula move midline. Tongue protrusion into either cheek is normal. Shoulder shrug is normal.   Motor exam:   Normal tone ,muscle bulk and symmetric  strength in all extremities. Sensory:  Fine touch, pinprick and vibration were tested in all extremities. Proprioception was normal. Coordination: Rapid alternating movements in the fingers/hands were normal. Finger-to-nose maneuver  normal without evidence of ataxia, dysmetria or tremor. Gait and station:  Patient walks without assistive device and is able unassisted to climb up to the exam table. Strength within normal limits. Mrs. Varin feels that she drifts to the left when walking. She turned with 3 steps Stance is stable and normal. Tandem gait is unfragmented. Romberg testing is negative   Deep tendon reflexes: in the  upper and lower extremities are symmetric and intact. Babinski maneuver response is  downgoing.   Assessment:  After physical and neurologic examination, review of laboratory studies, imaging, neurophysiology testing and pre-existing records, assessment is that of :   I suspect that this is benign positional vertigo. The patient has a sensation of being in motion when she actually sits or stands still. She started steady with her eyes closed only a slight swaying was noted. Passive movement of the head while her eyes were closed produced the dizziness.Eye bobbing was noted, but self-limited. To the left   I will refer her to vestibular rehabilitation. The patient is unaware of that mechanism has been prescribed should have been prescribed.    Plan:  Treatment plan and additional workup : Vestibular rehab. [place].       Asencion Partridge Jonice Cerra MD 05/04/2016

## 2016-05-24 ENCOUNTER — Ambulatory Visit: Payer: 59 | Attending: Neurology | Admitting: Physical Therapy

## 2016-05-24 ENCOUNTER — Encounter: Payer: Self-pay | Admitting: Physical Therapy

## 2016-05-24 DIAGNOSIS — R42 Dizziness and giddiness: Secondary | ICD-10-CM | POA: Insufficient documentation

## 2016-05-24 DIAGNOSIS — R2681 Unsteadiness on feet: Secondary | ICD-10-CM | POA: Diagnosis present

## 2016-05-25 NOTE — Therapy (Signed)
[facility] [address] [place], [place], [address] Phone: [phone]   Fax:  [phone]  Physical Therapy Evaluation  Patient Details  Name: Sara Baker MRN: [id number] Date of Birth: Nov 27, 1940 Referring Provider: Dr. Asencion Partridge Dohmeier  Encounter Date: 05/24/2016      PT End of Session - 05/25/16 2244    Visit Number 1   Number of Visits 4   Date for PT Re-Evaluation 06/23/16   Authorization Type Medicare   Authorization Time Period 05-24-16 - 07-10-16   PT Start Time 1401   PT Stop Time 1445   PT Time Calculation (min) 44 min      Past Medical History:  Diagnosis Date  . Anemia   . Anxiety   . Arthritis    fingers, knee - otc meds prn  . Blood transfusion 10 + yrs   Wash DC  . Chronic sinusitis   . Colitis   . Cough    occasional - tx with albuterol inhaler  . GERD (gastroesophageal reflux disease)    diet controlled  . Hypertension   . Hypothyroidism   . SVD (spontaneous vaginal delivery)    x 5    Past Surgical History:  Procedure Laterality Date  . ABDOMINAL HYSTERECTOMY  07/05/2012   Procedure: HYSTERECTOMY ABDOMINAL;  Surgeon: Melina Schools, MD;  Location: [facility];  Service: Gynecology;  Laterality: N/A;  . BREAST SURGERY  1968   benign tumor  . CATARACT EXTRACTION W/ INTRAOCULAR LENS  IMPLANT, BILATERAL  2011  . DILATION AND CURETTAGE OF UTERUS  2013  . EYE SURGERY    . SALPINGOOPHORECTOMY  07/05/2012   Procedure: SALPINGO OOPHORECTOMY;  Surgeon: Melina Schools, MD;  Location: [facility];  Service: Gynecology;  Laterality: Bilateral;  . 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 TUBAL LIGATION  1982    There were no vitals filed for this visit.           [facility] PT Assessment - 05/25/16 0001      Assessment   Medical Diagnosis Vertigo   Referring Provider Dr. Asencion Partridge Dohmeier   Prior Therapy none     Balance Screen   Has the patient fallen in the past 6 months No   Has  the patient had a decrease in activity level because of a fear of falling?  No   Is the patient reluctant to leave their home because of a fear of falling?  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, museum with [employer]            Vestibular Assessment - 05/25/16 0001      Vestibular Assessment   General Observation Pt is a 75 year old lady  with c/o dizziness with quick turns, occasional tinnitus and an itching sensation in R ear.,     Symptom Behavior   Type of Dizziness Unsteady with head/body turns  imbalance   Frequency of Dizziness varies   Duration of Dizziness minutes   Aggravating Factors Turning body quickly;Turning head quickly     Occulomotor Exam   Occulomotor Alignment Normal   Smooth Pursuits Intact   Saccades Intact     Visual Acuity   Static line  11   Dynamic line 9      Tandem stance 15 secs                   PT Short Term Goals - 05/25/16 2250      PT SHORT TERM GOAL #1   Title Complete SOT and establish goal as appropriate.  06-23-16   Time 4   Period Weeks   Status New     PT SHORT TERM GOAL #2   Title Independent in HEP for vestibular/balance exercises as appropriate.  06-23-16   Time 4   Period Weeks   Status New           PT Long Term Goals - 05/25/16 10/26/51      PT LONG TERM GOAL #1   Title see STG's               Plan - 05/25/16 10-26-43    Clinical Impression Statement Pt is a 75 year old lady with c/.o dizziness which she attributes to sinus problems; pt reports headache and nausea accompany the dizzinesss but denies vomiting.  Pt did report increased vertigo with horizontal head turns at 5/10 intensity.  No signs or nystagmus noted which would be indicative of BPPV.  Gait is WFL's.   Rehab  Potential Good   PT Frequency 1x / week   PT Duration 4 weeks   PT Treatment/Interventions Vestibular;ADLs/Self Care Home Management;Canalith Repostioning;Functional mobility training;Gait training;Therapeutic activities;Therapeutic exercise;Balance training;Neuromuscular re-education;Patient/family education   PT Next Visit Plan do SOT   PT Home Exercise Plan to be determined   Consulted and Agree with Plan of Care Patient      Patient will benefit from skilled therapeutic intervention in order to improve the following deficits and impairments:  Dizziness, Decreased balance  Visit Diagnosis: Dizziness and giddiness - Plan: PT plan of care cert/re-cert  Unsteadiness on feet - Plan: PT plan of care cert/re-cert      G-Codes - [id number] 2252-10-25    Functional Assessment Tool Used clinical judgment   Functional Limitation Changing and maintaining body position   Changing and Maintaining Body Position Current Status [id number]) At least 40 percent but less than 60 percent impaired, limited or restricted   Changing and Maintaining Body Position Goal Status [id number]) At least 20 percent but less than 40 percent impaired, limited or restricted       Problem List There are no active problems to display for this patient.   [name], PT 05/25/2016, 10:59 PM  [facility] [address] [place], [place], [address] Phone: [phone]   Fax:  [phone]  Name: Sara Baker MRN: [id number] Date of Birth: Mar 15, 1941

## 2016-06-13 ENCOUNTER — Ambulatory Visit: Payer: 59 | Attending: Neurology | Admitting: Physical Therapy

## 2016-06-13 DIAGNOSIS — R2681 Unsteadiness on feet: Secondary | ICD-10-CM | POA: Insufficient documentation

## 2016-06-13 DIAGNOSIS — R42 Dizziness and giddiness: Secondary | ICD-10-CM | POA: Diagnosis not present

## 2016-06-14 NOTE — Patient Instructions (Signed)
Balance: Eyes Closed - Bilateral (Varied Surfaces)    Stand, feet shoulder width, close eyes. Maintain balance __30__ seconds. Repeat __2_ times per set. Do _1___ sets per session. Do ___5_ sessions per week. Repeat on compliant surface: foam.  Copyright  VHI. All rights reserved.

## 2016-06-14 NOTE — Therapy (Signed)
[facility] [address] [place], [place], [address] Phone: [phone]   Fax:  [phone]  Physical Therapy Treatment  Patient Details  Name: Sara Baker MRN: [id number] Date of Birth: [birth year] Referring Provider: Dr. Asencion Partridge Dohmeier  Encounter Date: 06/13/2016      PT End of Session - 06/14/16 1635    Visit Number 2   Number of Visits 4   Date for PT Re-Evaluation 06/23/16   Authorization Type Medicare   Authorization Time Period 05-24-16 - 07-10-16   PT Start Time 1535   PT Stop Time 1616   PT Time Calculation (min) 41 min      Past Medical History:  Diagnosis Date  . Anemia   . Anxiety   . Arthritis    fingers, knee - otc meds prn  . Blood transfusion 10 + yrs   Wash DC  . Chronic sinusitis   . Colitis   . Cough    occasional - tx with albuterol inhaler  . GERD (gastroesophageal reflux disease)    diet controlled  . Hypertension   . Hypothyroidism   . SVD (spontaneous vaginal delivery)    x 5    Past Surgical History:  Procedure Laterality Date  . ABDOMINAL HYSTERECTOMY  07/05/2012   Procedure: HYSTERECTOMY ABDOMINAL;  Surgeon: Melina Schools, MD;  Location: [facility];  Service: Gynecology;  Laterality: N/A;  . BREAST SURGERY  1968   benign tumor  . CATARACT EXTRACTION W/ INTRAOCULAR LENS  IMPLANT, BILATERAL  2011  . DILATION AND CURETTAGE OF UTERUS  2013  . EYE SURGERY    . SALPINGOOPHORECTOMY  07/05/2012   Procedure: SALPINGO OOPHORECTOMY;  Surgeon: Melina Schools, MD;  Location: [facility];  Service: Gynecology;  Laterality: Bilateral;  . 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 TUBAL LIGATION  1982    There were no vitals filed for this visit.      Subjective Assessment - 06/14/16 1633    Subjective Pt feels that she has been doing better since last visit 3 weeks ago; finds that she is    Pertinent History HTN; h/o ear infections; chronic sinusitis    Diagnostic tests MRI - done  in Oct. 2017 - negative   Patient Stated Goals improve balance; reduce dizziness   Currently in Pain? 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 57/100 with N= 65/100 Somatosensory input WNL's Visual decreased at 55/100 with N= 73/100 Vestibular minimal input per SOT with N= 50/100, however, computer score is not indicative of pt's function with all 3 trials on condition 6 very high above Normal and pt is reporting no significant balance problems at this time - pt attributes previous dizziness to possible side effect of medication, which she states medication has been changed as of current time  Pt instructed in SLS, tandem stance and balance on foam to do if she so desires - balance and gait are WFL's at this time                      PT Education - 06/14/16 1634    Education provided Yes   Education Details SLS, tandem stance and balance on foam   Person(s) Educated Patient   Methods Explanation;Demonstration;Handout   Comprehension Verbalized understanding;Returned demonstration          PT Short Term Goals - 06/14/16 1635  PT SHORT TERM GOAL #1   Title Complete SOT and establish goal as appropriate.  06-23-16   Baseline met 07-05-2016   Status Achieved     PT SHORT TERM GOAL #2   Title Independent in HEP for vestibular/balance exercises as appropriate.  06-23-16   Status Achieved           PT Long Term Goals - 05/25/16 2253      PT LONG TERM GOAL #1   Title see STG's             Patient will benefit from skilled therapeutic intervention in order to improve the following deficits and impairments:     Visit Diagnosis: Dizziness and giddiness  Unsteadiness on feet       G-Codes - 2016/07/05 1636    Functional Assessment Tool Used clinical judgment   Functional Limitation Changing and maintaining body position   Changing and Maintaining Body Position Goal Status (V5732) At least 20 percent but less than 40  percent impaired, limited or restricted   Changing and Maintaining Body Position Discharge Status (Q5672) At least 1 percent but less than 20 percent impaired, limited or restricted      Problem List There are no active problems to display for this patient.    PHYSICAL THERAPY DISCHARGE SUMMARY  Visits from Start of Care: 2  Current functional level related to goals / functional outcomes:  LTG's met - pt reports much improvement in vertigo at current time - states it is much better than it was at initial PT eval 3 wks ago:  Pt attributes vertigo as possible side effect of medication at that time - she reports medication has been changed but does not know name of it at time of today's PT appt   Remaining deficits: Minimally decr. High level balance skills   Education / Equipment: Pt has been instructed in HEP for high level balance exercises and vestibular exercises - balance and gait are WFL's at this time; no additional PT warranted at this time due to resolution of dizziness Plan: Patient agrees to discharge.  Patient goals were met. Patient is being discharged due to meeting the stated rehab goals.  ?????          [name], PT 06/14/2016, 4:39 PM  [facility] [address] [place], [place], [address] Phone: [phone]   Fax:  [phone]  Name: Sara Baker MRN: [id number] Date of Birth: July 11, 1941

## 2016-10-11 ENCOUNTER — Encounter: Payer: Self-pay | Admitting: Internal Medicine

## 2016-11-29 ENCOUNTER — Ambulatory Visit (AMBULATORY_SURGERY_CENTER): Payer: Self-pay

## 2016-11-29 VITALS — Ht 64.0 in | Wt 173.0 lb

## 2016-11-29 DIAGNOSIS — Z86010 Personal history of colonic polyps: Secondary | ICD-10-CM

## 2016-11-29 MED ORDER — NA SULFATE-K SULFATE-MG SULF 17.5-3.13-1.6 GM/177ML PO SOLN: 1.0000 | Freq: Once | ORAL | 0 refills | Status: AC

## 2016-11-29 NOTE — Progress Notes (Signed)
Denies allergies to eggs or soy products. Denies complication of anesthesia or sedation. Denies use of weight loss medication. Denies use of O2.   Emmi instructions declined. Patient does not give out email.

## 2016-12-13 ENCOUNTER — Encounter: Payer: Medicare Other | Admitting: Internal Medicine

## 2016-12-20 ENCOUNTER — Encounter: Payer: Self-pay | Admitting: Internal Medicine

## 2017-01-03 ENCOUNTER — Encounter: Payer: Self-pay | Admitting: Internal Medicine

## 2017-01-03 ENCOUNTER — Ambulatory Visit (AMBULATORY_SURGERY_CENTER): Payer: Medicare Other | Admitting: Internal Medicine

## 2017-01-03 VITALS — BP 155/96 | HR 91 | Temp 98.0°F | Resp 21 | Ht 64.0 in | Wt 173.0 lb

## 2017-01-03 DIAGNOSIS — Z860100 Personal history of colon polyps, unspecified: Secondary | ICD-10-CM

## 2017-01-03 DIAGNOSIS — D124 Benign neoplasm of descending colon: Secondary | ICD-10-CM

## 2017-01-03 DIAGNOSIS — D122 Benign neoplasm of ascending colon: Secondary | ICD-10-CM | POA: Diagnosis not present

## 2017-01-03 DIAGNOSIS — D120 Benign neoplasm of cecum: Secondary | ICD-10-CM

## 2017-01-03 DIAGNOSIS — D125 Benign neoplasm of sigmoid colon: Secondary | ICD-10-CM | POA: Diagnosis not present

## 2017-01-03 DIAGNOSIS — D123 Benign neoplasm of transverse colon: Secondary | ICD-10-CM

## 2017-01-03 DIAGNOSIS — K635 Polyp of colon: Secondary | ICD-10-CM

## 2017-01-03 MED ORDER — SODIUM CHLORIDE 0.9 % IV SOLN: 500.0000 mL | INTRAVENOUS | Status: DC

## 2017-01-03 NOTE — Progress Notes (Signed)
Alert and oriented x3, pleased with MAC, report to RN Sarah 

## 2017-01-03 NOTE — Op Note (Signed)
[facility] Patient Name: Sara Baker Procedure Date: 01/03/2017 1:26 PM MRN: [id number] Endoscopist: Jerene Bears , MD Age: 76 Referring MD:  Date of Birth: May 15, 1941 Gender: Female Account #: [id number] Procedure:                Colonoscopy Indications:              Surveillance: Personal history of adenomatous                            polyps on last colonoscopy 5 years ago Medicines:                Monitored Anesthesia Care Procedure:                Pre-Anesthesia Assessment:                           - Prior to the procedure, a History and Physical                            was performed, and patient medications and                            allergies were reviewed. The patient's tolerance of                            previous anesthesia was also reviewed. The risks                            and benefits of the procedure and the sedation                            options and risks were discussed with the patient.                            All questions were answered, and informed consent                            was obtained. Prior Anticoagulants: The patient has                            taken no previous anticoagulant or antiplatelet                            agents. ASA Grade Assessment: III - A patient with                            severe systemic disease. After reviewing the risks                            and benefits, the patient was deemed in                            satisfactory condition to undergo the procedure.  After obtaining informed consent, the colonoscope                            was passed under direct vision. Throughout the                            procedure, the patient's blood pressure, pulse, and                            oxygen saturations were monitored continuously. The                            Colonoscope was introduced through the anus and                            advanced to the the cecum,  identified by                            appendiceal orifice and ileocecal valve. The                            colonoscopy was performed without difficulty. The                            patient tolerated the procedure well. The quality                            of the bowel preparation was good. The ileocecal                            valve, appendiceal orifice, and rectum were                            photographed. Scope In: 1:50:17 PM Scope Out: 2:13:38 PM Scope Withdrawal Time: 0 hours 19 minutes 41 seconds  Total Procedure Duration: 0 hours 23 minutes 21 seconds  Findings:                 The digital rectal exam was normal.                           A 6 mm polyp was found in the cecum. The polyp was                            sessile. The polyp was removed with a cold snare.                            Resection and retrieval were complete.                           Five sessile polyps were found in the ascending                            colon. The polyps were 3 to 6 mm in size.  These                            polyps were removed with a cold snare. Resection                            and retrieval were complete.                           Two sessile polyps were found in the transverse                            colon. The polyps were 3 to 5 mm in size. These                            polyps were removed with a cold snare. Resection                            and retrieval were complete.                           A 4 mm polyp was found in the descending colon. The                            polyp was sessile. The polyp was removed with a                            cold snare. Resection and retrieval were complete.                           A 7 mm polyp was found in the sigmoid colon. The                            polyp was sessile. The polyp was removed with a                            cold snare. Resection and retrieval were complete.                           Multiple  small and large-mouthed diverticula were                            found in the sigmoid colon and descending colon.                           Internal hemorrhoids were found during                            retroflexion. The hemorrhoids were small. Complications:            No immediate complications. Estimated Blood Loss:     Estimated blood loss was minimal. Impression:               - One 6 mm polyp in the cecum,  removed with a cold                            snare. Resected and retrieved.                           - Five 3 to 6 mm polyps in the ascending colon,                            removed with a cold snare. Resected and retrieved.                           - Two 3 to 5 mm polyps in the transverse colon,                            removed with a cold snare. Resected and retrieved.                           - One 4 mm polyp in the descending colon, removed                            with a cold snare. Resected and retrieved.                           - One 7 mm polyp in the sigmoid colon, removed with                            a cold snare. Resected and retrieved.                           - Mild diverticulosis in the sigmoid colon and in                            the descending colon.                           - Internal hemorrhoids. Recommendation:           - Patient has a contact number available for                            emergencies. The signs and symptoms of potential                            delayed complications were discussed with the                            patient. Return to normal activities tomorrow.                            Written discharge instructions were provided to the                            patient.                           -  Resume previous diet.                           - Continue present medications.                           - Await pathology results.                           - Repeat colonoscopy is recommended for                             surveillance of multiple polyps. The colonoscopy                            date will be determined after pathology results                            from today's exam become available for review. Jerene Bears, MD 01/03/2017 2:27:05 PM This report has been signed electronically.

## 2017-01-03 NOTE — Progress Notes (Signed)
Called to room to assist during endoscopic procedure.  Patient ID and intended procedure confirmed with present staff. Received instructions for my participation in the procedure from the performing physician.  

## 2017-01-03 NOTE — Patient Instructions (Signed)
YOU HAD AN ENDOSCOPIC PROCEDURE TODAY AT [facility]:   Refer to the procedure report that was given to you for any specific questions about what was found during the examination.  If the procedure report does not answer your questions, please call your gastroenterologist to clarify.  If you requested that your care partner not be given the details of your procedure findings, then the procedure report has been included in a sealed envelope for you to review at your convenience later.  YOU SHOULD EXPECT: Some feelings of bloating in the abdomen. Passage of more gas than usual.  Walking can help get rid of the air that was put into your GI tract during the procedure and reduce the bloating. If you had a lower endoscopy (such as a colonoscopy or flexible sigmoidoscopy) you may notice spotting of blood in your stool or on the toilet paper. If you underwent a bowel prep for your procedure, you may not have a normal bowel movement for a few days.  Please Note:  You might notice some irritation and congestion in your nose or some drainage.  This is from the oxygen used during your procedure.  There is no need for concern and it should clear up in a day or so.  SYMPTOMS TO REPORT IMMEDIATELY:   Following lower endoscopy (colonoscopy or flexible sigmoidoscopy):  Excessive amounts of blood in the stool  Significant tenderness or worsening of abdominal pains  Swelling of the abdomen that is new, acute  Fever of 100F or higher  For urgent or emergent issues, a gastroenterologist can be reached at any hour by calling [phone].   DIET:  We do recommend a small meal at first, but then you may proceed to your regular diet.  Drink plenty of fluids but you should avoid alcoholic beverages for 24 hours.  ACTIVITY:  You should plan to take it easy for the rest of today and you should NOT DRIVE or use heavy machinery until tomorrow (because of the sedation medicines used during the test).     FOLLOW UP: Our staff will call the number listed on your records the next business day following your procedure to check on you and address any questions or concerns that you may have regarding the information given to you following your procedure. If we do not reach you, we will leave a message.  However, if you are feeling well and you are not experiencing any problems, there is no need to return our call.  We will assume that you have returned to your regular daily activities without incident.  If any biopsies were taken you will be contacted by phone or by letter within the next 1-3 weeks.  Please call us at [phone] if you have not heard about the biopsies in 3 weeks.   Await for biopsy results to determine next repeat Colonoscopy screening Hemorrhoids (handout given) Polyps (handout given) Diverticulosis (handout given) High Fiber Diet (handout given)   SIGNATURES/CONFIDENTIALITY: You and/or your care partner have signed paperwork which will be entered into your electronic medical record.  These signatures attest to the fact that that the information above on your After Visit Summary has been reviewed and is understood.  Full responsibility of the confidentiality of this discharge information lies with you and/or your care-partner.

## 2017-01-04 ENCOUNTER — Telehealth: Payer: Self-pay | Admitting: *Deleted

## 2017-01-04 NOTE — Telephone Encounter (Signed)
  Follow up Call-  Call back number 01/03/2017  Post procedure Call Back phone  # [phone]  Permission to leave phone message Yes  Some recent data might be hidden     Patient questions:  Do you have a fever, pain , or abdominal swelling? No. Pain Score  0 *  Have you tolerated food without any problems? Yes.    Have you been able to return to your normal activities? Yes.    Do you have any questions about your discharge instructions: Diet   No. Medications  No. Follow up visit  No.  Do you have questions or concerns about your Care? No.  Actions: * If pain score is 4 or above: No action needed, pain <4.

## 2017-01-13 ENCOUNTER — Encounter: Payer: Self-pay | Admitting: Internal Medicine

## 2017-05-19 ENCOUNTER — Ambulatory Visit
Admission: RE | Admit: 2017-05-19 | Discharge: 2017-05-19 | Disposition: A | Discharge status: Home / Self Care | Payer: Medicare Other | Source: Ambulatory Visit | Attending: Family Medicine | Admitting: Family Medicine

## 2017-05-19 ENCOUNTER — Other Ambulatory Visit: Payer: Self-pay | Admitting: Family Medicine

## 2017-05-19 DIAGNOSIS — R52 Pain, unspecified: Secondary | ICD-10-CM

## 2017-11-10 ENCOUNTER — Encounter: Payer: Self-pay | Admitting: Internal Medicine

## 2017-11-27 ENCOUNTER — Ambulatory Visit: Payer: Medicare Other | Admitting: Podiatry

## 2017-11-27 ENCOUNTER — Ambulatory Visit (INDEPENDENT_AMBULATORY_CARE_PROVIDER_SITE_OTHER): Payer: Medicare Other

## 2017-11-27 DIAGNOSIS — M76829 Posterior tibial tendinitis, unspecified leg: Secondary | ICD-10-CM

## 2017-11-27 DIAGNOSIS — M7751 Other enthesopathy of right foot: Secondary | ICD-10-CM | POA: Diagnosis not present

## 2017-11-27 DIAGNOSIS — M779 Enthesopathy, unspecified: Secondary | ICD-10-CM

## 2017-11-27 MED ORDER — METHYLPREDNISOLONE 4 MG PO TBPK: ORAL_TABLET | ORAL | 0 refills | Status: DC

## 2017-11-27 NOTE — Progress Notes (Signed)
Dg f 

## 2017-11-29 NOTE — Progress Notes (Signed)
Subjective:   Patient ID: Sara Baker, female   DOB: 77 y.o.   MRN: [id number]   HPI 77 year old female presents the office with concerns of right ankle pain.  She said that she has been having pain since January 2019.  She previous had x-rays performed which were negative.  She points to the inside aspect she has majority of symptoms.  She been having swelling for the last 3 to 4 months.  She said it hurts when walking down steps and occasionally get a sharp pain to her ankle.  She denies any recent injury.  Denies any redness associate with the swelling.  She has no other concerns today.   Review of Systems  All other systems reviewed and are negative.  Past Medical History:  Diagnosis Date  . Allergy   . Anemia   . Anxiety   . Arthritis    fingers, knee - otc meds prn  . Asthma   . Blood transfusion 10 + yrs   Wash DC  . Cataract   . Chronic sinusitis   . Colitis   . Cough    occasional - tx with albuterol inhaler  . GERD (gastroesophageal reflux disease)    diet controlled  . Hyperlipidemia   . Hypertension   . Hypothyroidism   . SVD (spontaneous vaginal delivery)    x 5    Past Surgical History:  Procedure Laterality Date  . ABDOMINAL HYSTERECTOMY  07/05/2012   Procedure: HYSTERECTOMY ABDOMINAL;  Surgeon: Melina Schools, MD;  Location: [facility];  Service: Gynecology;  Laterality: N/A;  . BREAST SURGERY  1968   benign tumor  . CATARACT EXTRACTION W/ INTRAOCULAR LENS  IMPLANT, BILATERAL  2011  . DILATION AND CURETTAGE OF UTERUS  2013  . EYE SURGERY    . SALPINGOOPHORECTOMY  07/05/2012   Procedure: SALPINGO OOPHORECTOMY;  Surgeon: Melina Schools, MD;  Location: [facility];  Service: Gynecology;  Laterality: Bilateral;  . 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 TUBAL LIGATION  1982     Current Outpatient Medications:  .  amLODipine (NORVASC) 2.5 MG tablet, Take 2.5 mg by mouth daily., Disp: , Rfl:  .  Calcium-Magnesium-Zinc 1000-400-15 MG TABS, Take 1 tablet by  mouth daily., Disp: , Rfl:  .  Cholecalciferol (VITAMIN D3) 1000 UNITS CAPS, Take 1,000 Units by mouth daily., Disp: , Rfl:  .  Cyanocobalamin (VITAMIN B 12 PO), Take 1,000 mg by mouth daily., Disp: , Rfl:  .  Flaxseed, Linseed, (FLAX SEED OIL PO), Take by mouth daily. Pt. Not sure of dose but maybe 400mg ., Disp: , Rfl:  .  fluticasone (FLONASE) 50 MCG/ACT nasal spray, Place 2 sprays into both nostrils Daily., Disp: , Rfl:  .  ibuprofen (ADVIL,MOTRIN) 600 MG tablet, Take 1 tablet (600 mg total) by mouth every 6 (six) hours as needed for pain., Disp: 30 tablet, Rfl: 0 .  losartan-hydrochlorothiazide (HYZAAR) 100-25 MG tablet, Take 1 tablet by mouth daily., Disp: , Rfl:  .  lovastatin (MEVACOR) 20 MG tablet, Take 20 mg by mouth at bedtime., Disp: , Rfl:  .  methylPREDNISolone (MEDROL DOSEPAK) 4 MG TBPK tablet, Take as directed, Disp: 21 tablet, Rfl: 0 .  Polyethyl Glycol-Propyl Glycol (SYSTANE FREE OP), Apply 1 drop to eye as needed. For Dry Eyes., Disp: , Rfl:  .  PROAIR HFA 108 (90 BASE) MCG/ACT inhaler, Inhale 2 puffs into the lungs as needed. Shortness of breath and cough, Disp: , Rfl:   Current  Facility-Administered Medications:  .  0.9 %  sodium chloride infusion, 500 mL, Intravenous, Continuous, Pyrtle, Lajuan Lines, MD  No Known Allergies        Objective:  Physical Exam  General: AAO x3, NAD  Dermatological: Skin is warm, dry and supple bilateral. Nails x 10 are well manicured; remaining integument appears unremarkable at this time. There are no open sores, no preulcerative lesions, no rash or signs of infection present.  Vascular: Dorsalis Pedis artery and Posterior Tibial artery pedal pulses are 2/4 bilateral with immedate capillary fill time. There is no pain with calf compression, swelling, warmth, erythema.   Neruologic: Grossly intact via light touch bilateral. Vibratory intact via tuning fork bilateral. Protective threshold with Semmes Wienstein monofilament intact to all pedal  sites bilateral.   Musculoskeletal: There is tenderness palpation of the course of the flexor tendons, posterior tibial tendon on the right side posterior and inferior to the medial malleolus.  She is able to do a single heel rise although minimally due to pain on that side.  There is no area pinpoint bony tenderness or pain to vibratory sensation.  There is decreased medial arch height upon weightbearing.  Ankle, subtalar joint range of motion intact.  Achilles tendon appears to be intact without any pain.  There is no other areas of tenderness identified today.  Muscular strength 5/5 in all groups tested bilateral.  Gait: Unassisted, Nonantalgic.        77 year old female right posterior tibial tendinitis/dysfunction    Plan:  -Treatment options discussed including all alternatives, risks, and complications -Etiology of symptoms were discussed -X-rays were obtained and reviewed with the patient.  There is no evidence of acute fracture or stress fracture. -Tri-Lock ankle brace was dispensed as well as I ordered a Medrol Dosepak.  Ice the area as well and limit activity.  I will see her back in about 3 weeks.  If symptoms are improving we will start some rehab.  If she continues to have pain discussed an MRI. Discussed importance of wearing more supportive shoes as well.  Trula Slade DPM

## 2017-12-18 ENCOUNTER — Ambulatory Visit: Payer: Medicare Other | Admitting: Podiatry

## 2017-12-18 ENCOUNTER — Encounter: Payer: Self-pay | Admitting: Podiatry

## 2017-12-18 DIAGNOSIS — M76829 Posterior tibial tendinitis, unspecified leg: Secondary | ICD-10-CM | POA: Diagnosis not present

## 2017-12-18 DIAGNOSIS — M779 Enthesopathy, unspecified: Secondary | ICD-10-CM | POA: Diagnosis not present

## 2017-12-18 NOTE — Patient Instructions (Signed)
Posterior Tibialis Tendinosis Rehab Ask your health care provider which exercises are safe for you. Do exercises exactly as told by your health care provider and adjust them as directed. It is normal to feel mild stretching, pulling, tightness, or discomfort as you do these exercises, but you should stop right away if you feel sudden pain or your pain gets worse.Do not begin these exercises until told by your health care provider. Stretching and range of motion exercises These exercises warm up your muscles and joints and improve the movement and flexibility in your ankle and foot. These exercises may also help to relieve pain. Exercise A: Standing wall calf stretch, knee straight  1. Stand with your hands against a wall. 2. Extend your __________ leg behind you, and bend your front knee slightly. Keep both of your heels on the floor. 3. Point the toes of your back foot slightly inward. 4. Keeping your heels on the floor and your back knee straight, shift your weight toward the wall. Do not allow your back to arch. You should feel a gentle stretch in the back of your lower leg (calf). 5. Hold this position for __________ seconds. Repeat __________ times. Complete this stretch __________ times a day. Exercise B: Standing wall calf stretch, knee bent 1. Stand with your hands against a wall. 2. Extend your __________ leg behind you, and bend your front knee slightly. Keep both of your heels on the floor. 3. Point the toes of your back foot slightly inward. 4. Unlock your back knee so it is bent. Keep your heels on the floor. You should feel a gentle stretch deep in your calf. 5. Hold this position for __________ seconds. Repeat __________ times. Complete this exercise __________ times a day. Strengthening exercises These exercises build strength and endurance in your ankle and foot. Endurance is the ability to use your muscles for a long time, even after they get tired. Exercise C: Ankle inversion  with band 1. Secure one end of an exercise band or tubing to a fixed object, such as a table leg or a pole, that will stay still when the band is pulled. to an object that will not move if it is pulled on, like a table leg. 2. Loop the other end of the band around the middle of your left / right foot. 3. Sit on the floor facing the object with your __________ leg extended. The band or tube should be slightly tense when your foot is relaxed. 4. Leading with your big toe, slowly bring your __________ foot and ankle inward, toward your other foot. 5. Hold this position for __________ seconds. 6. Slowly return your foot to the starting position. Repeat __________ times. Complete this exercise __________ times a day. Exercise D: Towel curls  1. Sit in a chair on a non-carpeted surface, and put your feet on the floor. 2. Place a towel in front of your feet. If told by your health care provider, add __________ at the end of the towel. 3. Keeping your heel on the floor, put your __________ foot on the towel. 4. Pull the towel toward you by grabbing the towel with your toes and curling them under. Keep your heel on the floor. 5. Let your toes relax. 6. Grab the towel with your toes again. Keep going until the towel is completely underneath your foot. Repeat __________ times. Complete this exercise __________ times a day. Balance exercises These exercises improve or maintain your balance. Balance is important in preventing falls.   Exercise E: Single leg stand 1. Without shoes, stand near a railing or in a doorway. You can hold on to the railing or door frame as needed for balance. 2. Stand on your __________ foot. Keep your big toe down on the floor and try to keep your arch lifted. If balancing in this position is too easy, try the exercise with your eyes closed or while standing on a pillow. 3. Hold this position for __________ seconds. Repeat __________ times. Complete this exercise __________ times a  day. This information is not intended to replace advice given to you by your health care provider. Make sure you discuss any questions you have with your health care provider. Document Released: 06/27/2005 Document Revised: 03/01/2016 Document Reviewed: 03/13/2015 Elsevier Interactive Patient Education  2018 [employer]  

## 2017-12-19 NOTE — Progress Notes (Signed)
Subjective: 77 year old female presents the office today for follow-up evaluation of right ankle pain, tendinitis.  She states that ankle brace does help.  Reports she has stopped the Medrol Dosepak.  Increase her blood pressure she had follow-up with her primary care physician for that.  Overall the pain is improved some as well as the swelling however she still continues some discomfort. Denies any systemic complaints such as fevers, chills, nausea, vomiting. No acute changes since last appointment, and no other complaints at this time.   Objective: AAO x3, NAD DP/PT pulses palpable bilaterally, CRT less than 3 seconds On today's exam there is no significant tenderness palpation along the course the posterior tibial tendon on the insertion.  There is decrease edema to the area.  There is no area pinpoint bony tenderness or pain to vibratory sensation.  Decreased medial arch height.  MMT 5/5.  Range of motion intact. No open lesions or pre-ulcerative lesions.  No pain with calf compression, swelling, warmth, erythema  Assessment: Right side posterior tibial tendinitis  Plan: -All treatment options discussed with the patient including all alternatives, risks, complications.  -Overall on clinical exam her pain is improved as well as the swelling.  I want to continue the ankle brace.  We also discussed changing shoes to get more support actually adding insert inside of her shoes to be helpful.  I did start some range of motion, rehab exercises to help strengthen the tendon as well.  She can continue ice to the area as well.  Before she did a blood pressure we will hold off on any anti-inflammatories as well.  Consider physical therapy. -Patient encouraged to call the office with any questions, concerns, change in symptoms.   Trula Slade DPM

## 2018-01-03 ENCOUNTER — Encounter: Payer: Self-pay | Admitting: Internal Medicine

## 2018-02-23 ENCOUNTER — Encounter: Payer: Self-pay | Admitting: Internal Medicine

## 2018-02-23 ENCOUNTER — Ambulatory Visit (AMBULATORY_SURGERY_CENTER): Payer: Self-pay

## 2018-02-23 VITALS — Ht 64.0 in | Wt 175.8 lb

## 2018-02-23 DIAGNOSIS — Z86010 Personal history of colonic polyps: Secondary | ICD-10-CM

## 2018-02-23 MED ORDER — NA SULFATE-K SULFATE-MG SULF 17.5-3.13-1.6 GM/177ML PO SOLN: 1.0000 | Freq: Once | ORAL | 0 refills | Status: AC

## 2018-02-23 NOTE — Progress Notes (Signed)
No egg or soy allergy known to patient  No issues with past sedation with any surgeries  or procedures, no intubation problems  No diet pills per patient No home 02 use per patient  No blood thinners per patient  Pt denies issues with constipation  No A fib or A flutter  EMMI video sent to pt's e mail. Pt denies  

## 2018-03-06 ENCOUNTER — Ambulatory Visit (AMBULATORY_SURGERY_CENTER): Payer: Medicare Other | Admitting: Internal Medicine

## 2018-03-06 ENCOUNTER — Encounter: Payer: Self-pay | Admitting: Internal Medicine

## 2018-03-06 VITALS — BP 152/65 | HR 91 | Temp 97.5°F | Resp 12 | Ht 64.0 in | Wt 175.0 lb

## 2018-03-06 DIAGNOSIS — D125 Benign neoplasm of sigmoid colon: Secondary | ICD-10-CM

## 2018-03-06 DIAGNOSIS — Z86010 Personal history of colonic polyps: Secondary | ICD-10-CM | POA: Diagnosis not present

## 2018-03-06 DIAGNOSIS — D124 Benign neoplasm of descending colon: Secondary | ICD-10-CM

## 2018-03-06 MED ORDER — SODIUM CHLORIDE 0.9 % IV SOLN: 500.0000 mL | Freq: Once | INTRAVENOUS | Status: DC

## 2018-03-06 NOTE — Progress Notes (Signed)
To PACU, VSS. Report to [email or web address] 

## 2018-03-06 NOTE — Patient Instructions (Signed)
Information on polyps given.   YOU HAD AN ENDOSCOPIC PROCEDURE TODAY AT THE [facility]:   Refer to the procedure report that was given to you for any specific questions about what was found during the examination.  If the procedure report does not answer your questions, please call your gastroenterologist to clarify.  If you requested that your care partner not be given the details of your procedure findings, then the procedure report has been included in a sealed envelope for you to review at your convenience later.  YOU SHOULD EXPECT: Some feelings of bloating in the abdomen. Passage of more gas than usual.  Walking can help get rid of the air that was put into your GI tract during the procedure and reduce the bloating. If you had a lower endoscopy (such as a colonoscopy or flexible sigmoidoscopy) you may notice spotting of blood in your stool or on the toilet paper. If you underwent a bowel prep for your procedure, you may not have a normal bowel movement for a few days.  Please Note:  You might notice some irritation and congestion in your nose or some drainage.  This is from the oxygen used during your procedure.  There is no need for concern and it should clear up in a day or so.  SYMPTOMS TO REPORT IMMEDIATELY:   Following lower endoscopy (colonoscopy or flexible sigmoidoscopy):  Excessive amounts of blood in the stool  Significant tenderness or worsening of abdominal pains  Swelling of the abdomen that is new, acute  Fever of 100F or higher     For urgent or emergent issues, a gastroenterologist can be reached at any hour by calling [phone].   DIET:  We do recommend a small meal at first, but then you may proceed to your regular diet.  Drink plenty of fluids but you should avoid alcoholic beverages for 24 hours.  ACTIVITY:  You should plan to take it easy for the rest of today and you should NOT DRIVE or use heavy machinery until tomorrow (because of the  sedation medicines used during the test).    FOLLOW UP: Our staff will call the number listed on your records the next business day following your procedure to check on you and address any questions or concerns that you may have regarding the information given to you following your procedure. If we do not reach you, we will leave a message.  However, if you are feeling well and you are not experiencing any problems, there is no need to return our call.  We will assume that you have returned to your regular daily activities without incident.  If any biopsies were taken you will be contacted by phone or by letter within the next 1-3 weeks.  Please call us at [phone] if you have not heard about the biopsies in 3 weeks.    SIGNATURES/CONFIDENTIALITY: You and/or your care partner have signed paperwork which will be entered into your electronic medical record.  These signatures attest to the fact that that the information above on your After Visit Summary has been reviewed and is understood.  Full responsibility of the confidentiality of this discharge information lies with you and/or your care-partner. 

## 2018-03-06 NOTE — Op Note (Signed)
[facility] Patient Name: Sara Baker Procedure Date: 03/06/2018 2:32 PM MRN: [id number] Endoscopist: Jerene Bears , MD Age: 77 Referring MD:  Date of Birth: [birth year] Gender: Female Account #: [id number] Procedure:                Colonoscopy Indications:              Surveillance: History of numerous (> 10) adenomas                            on last colonoscopy (< 3 yrs.), Last colonoscopy 1                            year ago Medicines:                Monitored Anesthesia Care Procedure:                Pre-Anesthesia Assessment:                           - Prior to the procedure, a History and Physical                            was performed, and patient medications and                            allergies were reviewed. The patient's tolerance of                            previous anesthesia was also reviewed. The risks                            and benefits of the procedure and the sedation                            options and risks were discussed with the patient.                            All questions were answered, and informed consent                            was obtained. Prior Anticoagulants: The patient has                            taken no previous anticoagulant or antiplatelet                            agents. ASA Grade Assessment: III - A patient with                            severe systemic disease. After reviewing the risks                            and benefits, the patient was deemed in  satisfactory condition to undergo the procedure.                           After obtaining informed consent, the colonoscope                            was passed under direct vision. Throughout the                            procedure, the patient's blood pressure, pulse, and                            oxygen saturations were monitored continuously. The                            Model PCF-[id number]) scope was introduced                             through the anus and advanced to the cecum,                            identified by appendiceal orifice and ileocecal                            valve. The colonoscopy was performed without                            difficulty. The patient tolerated the procedure                            well. The quality of the bowel preparation was                            good. The ileocecal valve, appendiceal orifice, and                            rectum were photographed. Scope In: 2:41:39 PM Scope Out: 2:58:36 PM Scope Withdrawal Time: 0 hours 11 minutes 26 seconds  Total Procedure Duration: 0 hours 16 minutes 57 seconds  Findings:                 The digital rectal exam was normal.                           A 6 mm polyp was found in the descending colon. The                            polyp was sessile. The polyp was removed with a                            cold snare. Resection and retrieval were complete.                           A 5 mm polyp was found in the sigmoid colon.  The                            polyp was sessile. The polyp was removed with a                            cold snare. Resection and retrieval were complete.                           Scattered medium-mouthed diverticula were found in                            the sigmoid colon and descending colon.                           The retroflexed view of the distal rectum and anal                            verge was normal and showed no anal or rectal                            abnormalities. Complications:            No immediate complications. Estimated Blood Loss:     Estimated blood loss was minimal. Impression:               - One 6 mm polyp in the descending colon, removed                            with a cold snare. Resected and retrieved.                           - One 5 mm polyp in the sigmoid colon, removed with                            a cold snare. Resected and retrieved.                            - Diverticulosis in the sigmoid colon and in the                            descending colon.                           - The distal rectum and anal verge are normal on                            retroflexion view. Recommendation:           - Patient has a contact number available for                            emergencies. The signs and symptoms of potential  delayed complications were discussed with the                            patient. Return to normal activities tomorrow.                            Written discharge instructions were provided to the                            patient.                           - Resume previous diet.                           - Continue present medications.                           - Await pathology results.                           - Repeat colonoscopy is recommended. The                            colonoscopy date will be determined after pathology                            results from today's exam become available for                            review. Jerene Bears, MD 03/06/2018 3:01:51 PM This report has been signed electronically.

## 2018-03-06 NOTE — Progress Notes (Signed)
Called to room to assist during endoscopic procedure.  Patient ID and intended procedure confirmed with present staff. Received instructions for my participation in the procedure from the performing physician.  

## 2018-03-06 NOTE — Progress Notes (Signed)
Pt's states no medical or surgical changes since previsit or office visit. 

## 2018-03-07 ENCOUNTER — Telehealth: Payer: Self-pay | Admitting: *Deleted

## 2018-03-07 NOTE — Telephone Encounter (Signed)
No answer for post procedure call back. Left message for patient to call with questions or concerns. SM 

## 2018-03-07 NOTE — Telephone Encounter (Signed)
First follow up call attempt.  Voicemail with number identifier.  Message left to call if any questions or concerns.

## 2018-03-16 ENCOUNTER — Encounter: Payer: Self-pay | Admitting: Internal Medicine

## 2018-04-05 ENCOUNTER — Telehealth: Payer: Self-pay | Admitting: Gastroenterology

## 2018-04-05 NOTE — Telephone Encounter (Signed)
Error

## 2018-10-04 ENCOUNTER — Other Ambulatory Visit: Payer: Self-pay | Admitting: Family Medicine

## 2018-10-04 DIAGNOSIS — R221 Localized swelling, mass and lump, neck: Secondary | ICD-10-CM

## 2018-10-08 ENCOUNTER — Ambulatory Visit
Admission: RE | Admit: 2018-10-08 | Discharge: 2018-10-08 | Disposition: A | Discharge status: Home / Self Care | Payer: Medicare Other | Source: Ambulatory Visit | Attending: Family Medicine | Admitting: Family Medicine

## 2018-10-08 ENCOUNTER — Other Ambulatory Visit: Payer: Self-pay

## 2018-10-08 DIAGNOSIS — R221 Localized swelling, mass and lump, neck: Secondary | ICD-10-CM

## 2018-10-12 ENCOUNTER — Other Ambulatory Visit: Payer: Self-pay | Admitting: Family Medicine

## 2018-10-12 DIAGNOSIS — R221 Localized swelling, mass and lump, neck: Secondary | ICD-10-CM

## 2018-10-16 ENCOUNTER — Other Ambulatory Visit: Payer: Self-pay

## 2018-10-16 ENCOUNTER — Ambulatory Visit
Admission: RE | Admit: 2018-10-16 | Discharge: 2018-10-16 | Disposition: A | Discharge status: Home / Self Care | Payer: Medicare Other | Source: Ambulatory Visit | Attending: Family Medicine | Admitting: Family Medicine

## 2018-10-16 DIAGNOSIS — R221 Localized swelling, mass and lump, neck: Secondary | ICD-10-CM

## 2018-10-16 MED ORDER — GADOBENATE DIMEGLUMINE 529 MG/ML IV SOLN
15.0000 mL | Freq: Once | INTRAVENOUS | Status: AC | PRN
  Administered 2018-10-16: 16:00:00 15 mL via INTRAVENOUS

## 2019-05-22 ENCOUNTER — Other Ambulatory Visit: Payer: Self-pay | Admitting: Neurology

## 2019-05-22 DIAGNOSIS — G43019 Migraine without aura, intractable, without status migrainosus: Secondary | ICD-10-CM

## 2019-06-02 ENCOUNTER — Ambulatory Visit: Admission: RE | Admit: 2019-06-02 | Payer: Medicare Other | Source: Ambulatory Visit

## 2019-06-12 ENCOUNTER — Other Ambulatory Visit: Payer: Self-pay

## 2019-06-12 ENCOUNTER — Ambulatory Visit
Admission: RE | Admit: 2019-06-12 | Discharge: 2019-06-12 | Disposition: A | Discharge status: Home / Self Care | Payer: Medicare Other | Source: Ambulatory Visit | Attending: Neurology | Admitting: Neurology

## 2019-06-12 DIAGNOSIS — G43019 Migraine without aura, intractable, without status migrainosus: Secondary | ICD-10-CM | POA: Diagnosis present

## 2021-05-08 IMAGING — MR MR HEAD W/O CM
12 series · 47 of 48 positions shown · non-contrast
Comparison: None.

CLINICAL DATA: Headaches, feeling off balance

EXAM:
MRI HEAD WITHOUT CONTRAST
TECHNIQUE: Multiplanar, multiecho pulse sequences of the brain and surrounding
structures were obtained without intravenous contrast.

[Series 5: ax dwi_tracew · axial · 3.0mm · 0.60mm/px · z∈[-116,+38]mm · 3 of 48 slices shown]
[im 1/48]
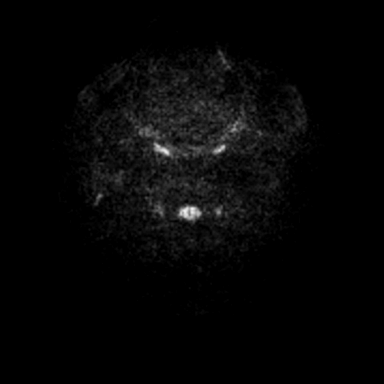
[im 24/48]
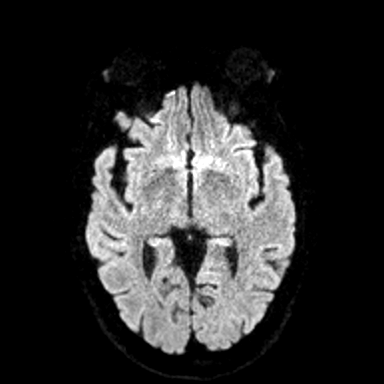
[im 48/48]
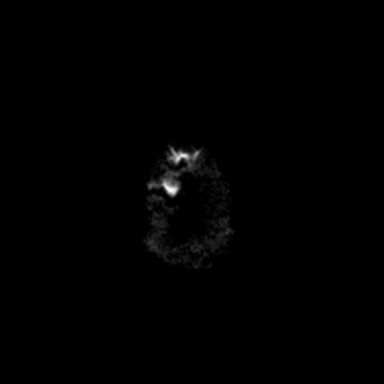

[Series 6: ax dwi_adc · axial · 3.0mm · 0.60mm/px · z∈[-116,+38]mm · 3 of 48 slices shown]
[im 1/48]
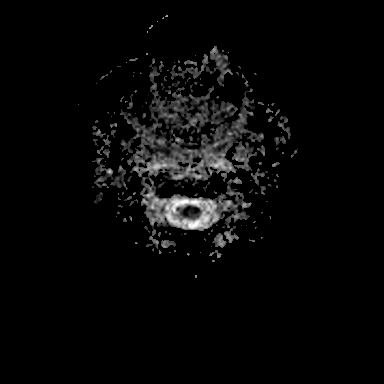
[im 24/48]
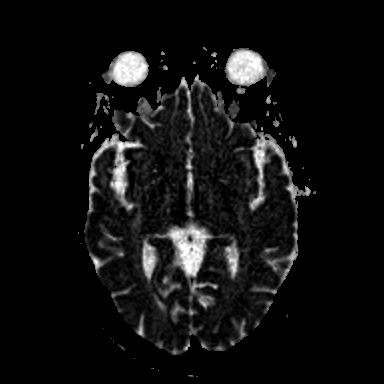
[im 48/48]
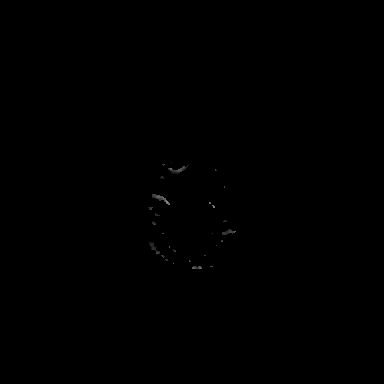

[Series 7: cor dwi_tracew · coronal · 5.0mm · 0.60mm/px · 2 of 38 slices shown]
[im 1/38]
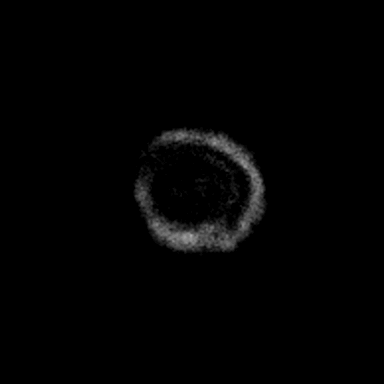
[im 38/38]
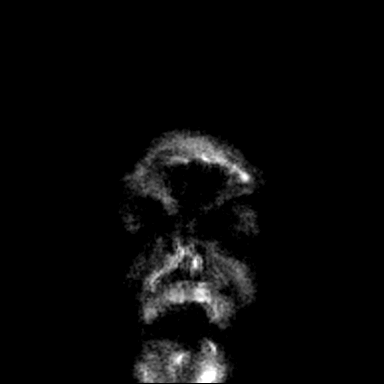

[Series 8: cor dwi_adc · coronal · 5.0mm · 0.60mm/px · 3 of 36 slices shown]
[im 1/36]
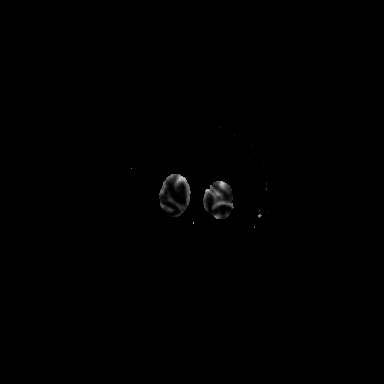
[im 18/36]
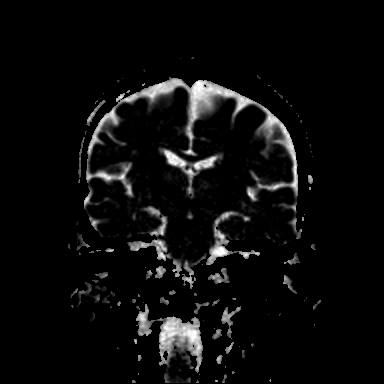
[im 36/36]
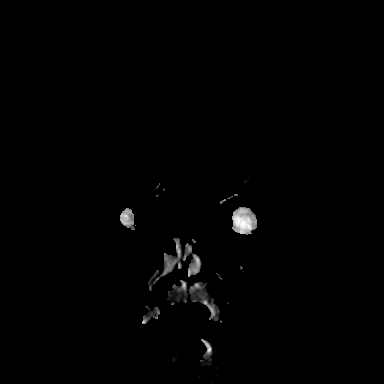

[Series 9: T1 · sagittal · 5.0mm · 0.62mm/px · 2 of 21 slices shown (1 of 2)]
[im 1/21]
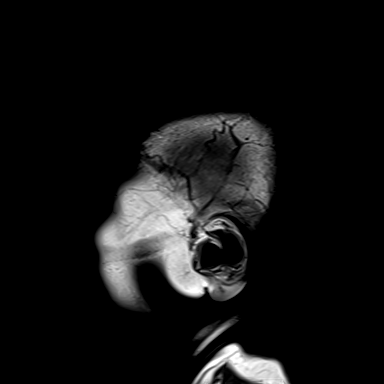
[im 21/21]
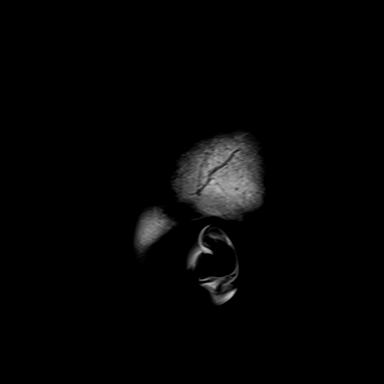

[Series 10: T2 · axial · 5.0mm · 0.53mm/px · z∈[-110,+33]mm · 2 of 25 slices shown]
[im 1/25]
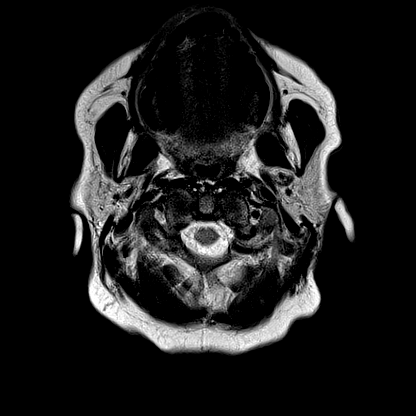
[im 25/25]
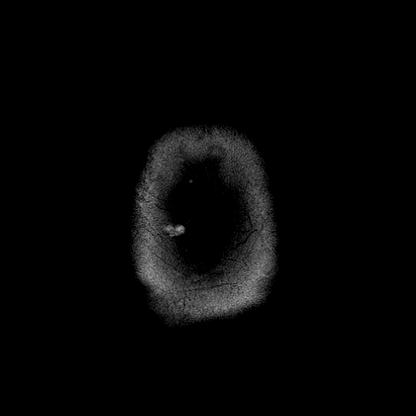

[Series 11: mag_images · axial · 3.0mm · 0.90mm/px · z∈[-126,+50]mm · 5 of 60 slices shown]
[im 1/60]
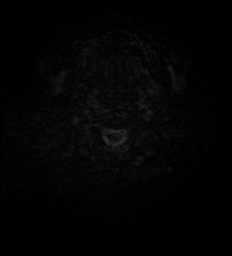
[im 15/60]
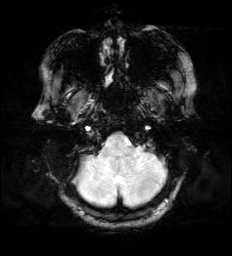
[im 30/60]
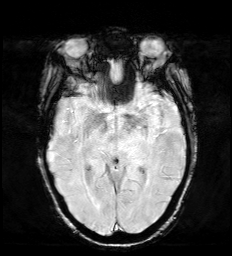
[im 45/60]
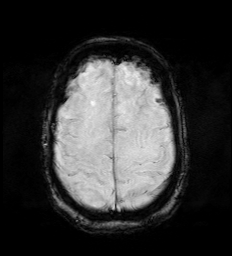
[im 60/60]
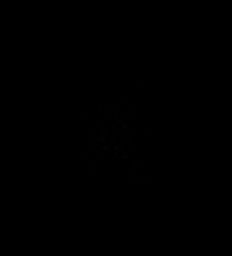

[Series 12: pha_images · axial · 3.0mm · 0.90mm/px · z∈[-126,+50]mm · 5 of 60 slices shown]
[im 1/60]
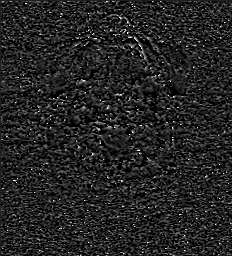
[im 15/60]
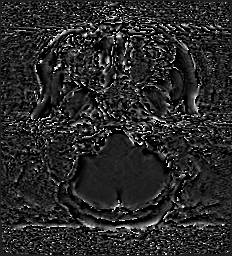
[im 30/60]
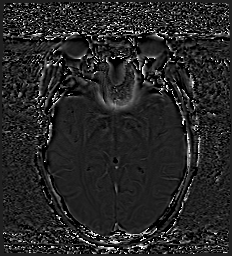
[im 45/60]
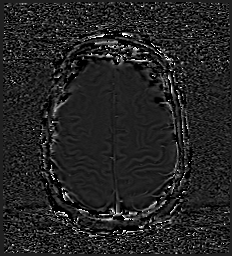
[im 60/60]
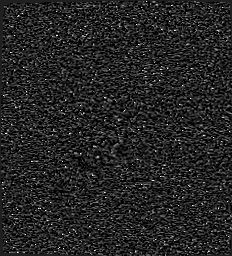

[Series 13: swi_images · axial · 3.0mm · 0.90mm/px · z∈[-126,+50]mm · 5 of 60 slices shown]
[im 1/60]
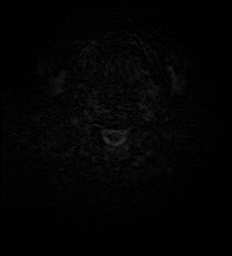
[im 15/60]
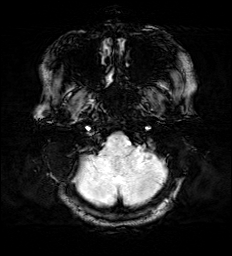
[im 30/60]
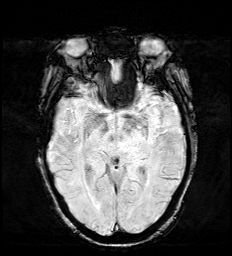
[im 45/60]
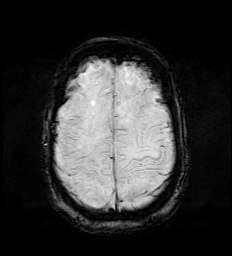
[im 60/60]
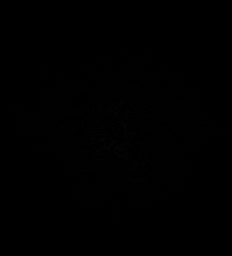

[Series 15: FLAIR · axial · 3.0mm · 0.53mm/px · z∈[-119,+42]mm · 4 of 55 slices shown]
[im 1/55]
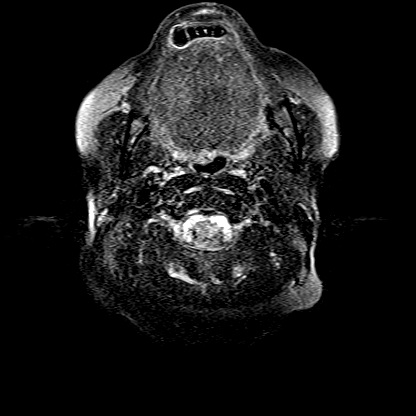
[im 19/55]
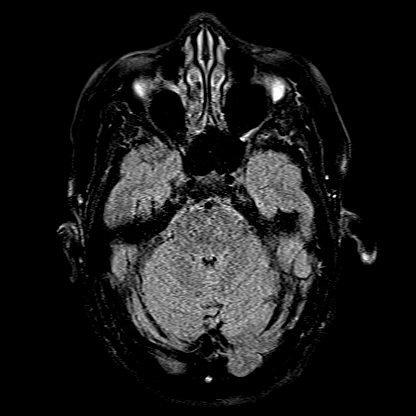
[im 37/55]
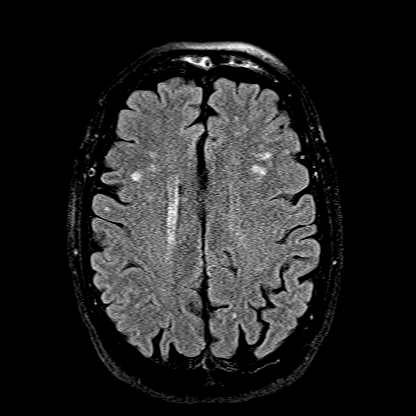
[im 55/55]
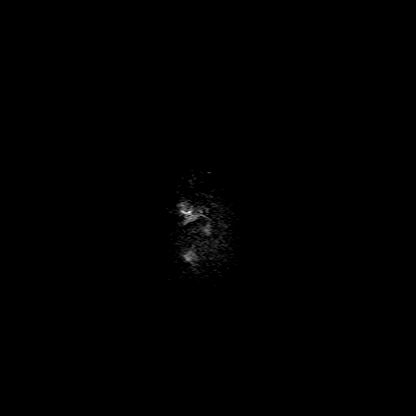

[Series 16: T1 · axial · 1.0mm · 0.98mm/px · z∈[-114,+44]mm · 11 of 160 slices shown (2 of 2)]
[im 1/160]
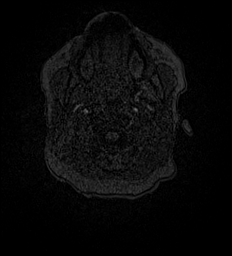
[im 15/160]
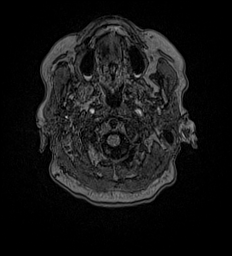
[im 29/160]
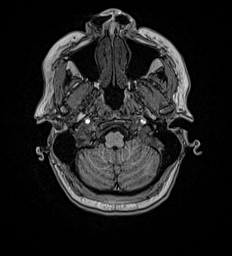
[im 44/160]
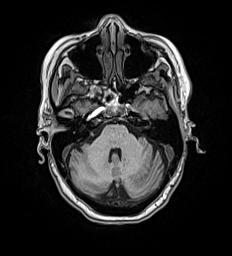
[im 58/160]
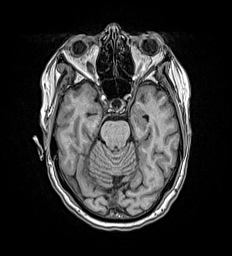
[im 73/160]
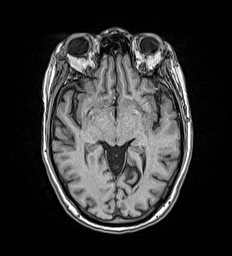
[im 87/160]
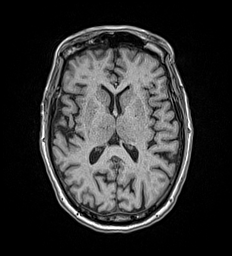
[im 102/160]
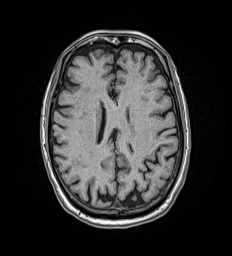
[im 116/160]
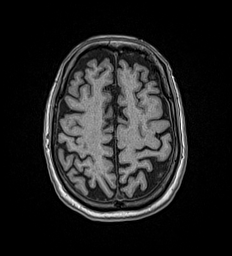
[im 131/160]
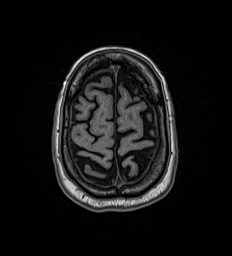
[im 160/160]
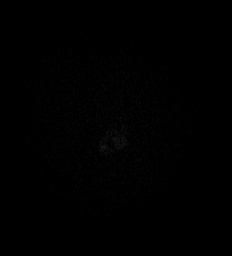

[Series 17: T2 post-contrast · coronal · 5.0mm · 0.57mm/px · 2 of 27 slices shown]
[im 1/27]
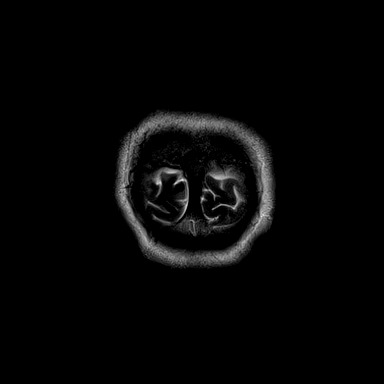
[im 27/27]
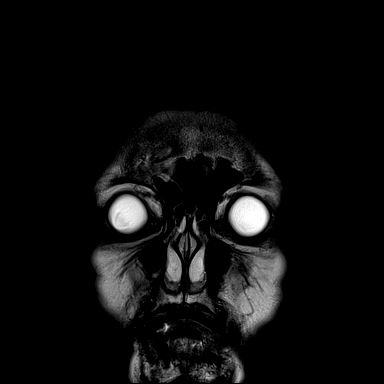

[47 of 48 positions shown; findings below may reference images not displayed]

FINDINGS: Brain: There is no acute infarction or intracranial hemorrhage.
There is no intracranial mass, mass effect, or edema. There is no
hydrocephalus or extra-axial fluid collection. Ventricles and sulci
are within normal limits in size and configuration. Scattered foci
of T2 hyperintensity in the supratentorial white matter are
nonspecific but may reflect mild to moderate chronic microvascular
ischemic changes.

Vascular: Major vessel flow voids at the skull base are preserved.

Skull and upper cervical spine: Normal marrow signal is preserved.

Sinuses/Orbits: Trace paranasal sinus mucosal thickening. Bilateral
lens replacements.

Other: Sella is unremarkable.  Mastoid air cells are clear.
IMPRESSION: No intracranial mass. Mild to moderate chronic microvascular
ischemic changes.

## 2021-06-24 ENCOUNTER — Ambulatory Visit: Payer: Medicare Other | Admitting: Physician Assistant

## 2021-07-01 ENCOUNTER — Encounter: Payer: Self-pay | Admitting: Internal Medicine

## 2021-08-13 ENCOUNTER — Encounter: Payer: Self-pay | Admitting: Physician Assistant

## 2021-08-13 ENCOUNTER — Ambulatory Visit: Payer: Medicare Other | Admitting: Physician Assistant

## 2021-08-13 VITALS — BP 148/86 | HR 76 | Ht 64.0 in | Wt 156.0 lb

## 2021-08-13 DIAGNOSIS — Z86010 Personal history of colonic polyps: Secondary | ICD-10-CM | POA: Diagnosis not present

## 2021-08-13 DIAGNOSIS — D369 Benign neoplasm, unspecified site: Secondary | ICD-10-CM | POA: Diagnosis not present

## 2021-08-13 MED ORDER — NA SULFATE-K SULFATE-MG SULF 17.5-3.13-1.6 GM/177ML PO SOLN: 1.0000 | Freq: Once | ORAL | 0 refills | Status: AC

## 2021-08-13 NOTE — Patient Instructions (Addendum)
If you are age 82 or older, your body mass index should be between 23-30. Your Body mass index is 26.78 kg/m. If this is out of the aforementioned range listed, please consider follow up with your Primary Care Provider. ________________________________________________________  The [facility] providers would like to encourage you to use [facility] to communicate with providers for non-urgent requests or questions.  Due to long hold times on the telephone, sending your provider a message by [facility] may be a faster and more efficient way to get a response.  Please allow 48 business hours for a response.  Please remember that this is for non-urgent requests.  _______________________________________________________  Sara Baker have been scheduled for a colonoscopy. Please follow written instructions given to you at your visit today.  Please pick up your prep supplies at the pharmacy within the next 1-3 days. If you use inhalers (even only as needed), please bring them with you on the day of your procedure.  Follow up as needed.  Thank you for entrusting me with your care and choosing [facility]  Amy Esterwood, PA-C

## 2021-08-13 NOTE — Progress Notes (Signed)
Subjective:    Patient ID: Sara Baker, female    DOB: 1940-08-20, 81 y.o.   MRN: [id number]  HPI  Sara Baker is an 81 year old African-American female, established with Dr. Hilarie Fredrickson who comes in today to discuss follow-up colonoscopy.  Patient has history of numerous adenomatous colon polyps, greater than 10 adenomas at time of colonoscopy in 2018.  She had repeat colonoscopy in August 2019 with removal of 2 polyps, 5 to 6 mm in size noted scattered diverticulosis.  Path on the polyps consistent with tubular adenomas,, and she was indicated for 3-year interval follow-up. She comes in today stating that she thought she was supposed to follow-up earlier but did not due to the [place] pandemic.  She is not having any active GI symptoms other than chronic constipation without any recent change.  She usually has about 3 bowel movements per week and will use Dulcolax or Ex-Lax a couple of times per week.  She has not noted any melena or hematochezia, no complaints of abdominal pain. She is generally in good health with history of hypertension, hypothyroidism, osteoarthritis.  Review of Systems Pertinent positive and negative review of systems were noted in the above HPI section.  All other review of systems was otherwise negative.   Outpatient Encounter Medications as of 08/13/2021  Medication Sig   amlodipine-benazepril (LOTREL) 2.5-10 MG capsule Take 1 capsule by mouth at bedtime.   Calcium-Magnesium-Zinc 1000-400-15 MG TABS Take 1 tablet by mouth daily.   Cholecalciferol (VITAMIN D3) 1000 UNITS CAPS Take 1,000 Units by mouth daily.   Cyanocobalamin (VITAMIN B 12 PO) Take 1,000 mg by mouth daily.   Flaxseed, Linseed, (FLAX SEED OIL PO) Take by mouth daily. Pt. Not sure of dose but maybe 465m.   fluticasone (FLONASE) 50 MCG/ACT nasal spray 1 spray daily as needed.   furosemide (LASIX) 20 MG tablet Take 20 mg by mouth daily.   Magnesium Hydroxide (DULCOLAX PO) Take 1 tablet by mouth as needed.   metoprolol  succinate (TOPROL-XL) 50 MG 24 hr tablet Take 50 mg by mouth daily.   Na Sulfate-K Sulfate-Mg Sulf 17.5-3.13-1.6 GM/177ML SOLN Take 1 kit by mouth once for 1 dose.   Polyethyl Glycol-Propyl Glycol (SYSTANE FREE OP) Apply 1 drop to eye as needed. For Dry Eyes.   Sennosides (EX-LAX PO) Take 1 tablet by mouth as needed.   [DISCONTINUED] albuterol (VENTOLIN HFA) 108 (90 Base) MCG/ACT inhaler ProAir HFA 90 mcg/actuation aerosol inhaler  inhale 2 puffs by mouth if needed (Patient not taking: Reported on 08/13/2021)   [DISCONTINUED] ibuprofen (ADVIL,MOTRIN) 600 MG tablet Take 1 tablet (600 mg total) by mouth every 6 (six) hours as needed for pain. (Patient not taking: Reported on 08/13/2021)   [DISCONTINUED] losartan (COZAAR) 100 MG tablet TAKE HALF TABLET BY MOUTH EVERY 24 HOURS FOR HYPERTENSION (Patient not taking: Reported on 08/13/2021)   No facility-administered encounter medications on file as of 08/13/2021.   Allergies  Allergen Reactions   Medrol [Methylprednisolone] Other (See Comments)    Increase blood pressure    Patient Active Problem List   Diagnosis Date Noted   Multiple adenomatous polyps 08/13/2021   Social History   Socioeconomic History   Marital status: Widowed    Spouse name: Not on file   Number of children: Not on file   Years of education: Not on file   Highest education level: Not on file  Occupational History   Not on file  Tobacco Use   Smoking status: Never   Smokeless  tobacco: Never  Vaping Use   Vaping Use: Never used  Substance and Sexual Activity   Alcohol use: No   Drug use: No   Sexual activity: Yes    Birth control/protection: Surgical  Other Topics Concern   Not on file  Social History Narrative   Not on file   Social Determinants of Health   Financial Resource Strain: Not on file  Food Insecurity: Not on file  Transportation Needs: Not on file  Physical Activity: Not on file  Stress: Not on file  Social Connections: Not on file  Intimate  Partner Violence: Not on file    Ms. Forrest's family history includes Colon polyps in her son; Stroke in her father and mother.      Objective:    Vitals:   08/13/21 1040  BP: (!) 148/86  Pulse: 76  SpO2: 98%    Physical Exam Well-developed well-nourished elderly AA female in no acute distress.appears younger than stated age  Height, Weight,156 BMI 26.7  HEENT; nontraumatic normocephalic, EOMI, PE R LA, sclera anicteric. Oropharynx;not examined today Neck; supple, no JVD Cardiovascular; regular rate and rhythm with S1-S2, no murmur rub or gallop Pulmonary; Clear bilaterally Abdomen; soft, nontender, nondistended, no palpable mass or hepatosplenomegaly, bowel sounds are active Rectal;not done today  Skin; benign exam, no jaundice rash or appreciable lesions Extremities; no clubbing cyanosis or edema skin warm and dry Neuro/Psych; alert and oriented x4, grossly nonfocal mood and affect appropriate        Assessment & Plan:   #76 81 year old African-American female in generally good health with history of numerous adenomatous colon polyps.  At colonoscopy in 2018 she had greater than 10 tubular adenomas removed.  Follow-up at 1 year interval in August 2019 with finding of 2 5 to 6 mm polyps, both were tubular adenomas.  Initial plan was for 3-year interval follow-up.  Patient is currently asymptomatic from a GI standpoint other than longstanding chronic constipation  #2 diverticulosis #3.  Hypertension #4.  Osteoarthritis #5.  Asthma  Plan; discussion today regarding indications for follow-up colonoscopy, and also general recommendation to discontinue surveillance colonoscopies at age 53. Patient is in good general health, and does have history of numerous adenomatous polyps, and therefore increased risk for colon cancer, and is a reasonable candidate for follow-up colonoscopy.  She would like to proceed with colonoscopy at this time, then would not pursue any further  colonoscopies in the future. Patient will be scheduled with Dr. Hilarie Fredrickson, again indications risk and benefits were discussed with the patient and she is agreeable to proceed. At this time patient is appropriate for endoscopic evaluation in the ambulatory care setting.  Pierre Dellarocco S Emmalynn Pinkham PA-C 08/13/2021   Cc: Leonard Downing, *

## 2021-08-14 NOTE — Progress Notes (Signed)
Addendum: Reviewed and agree with assessment and management plan. HIGHER THAN BASELINE RISK for colonoscopy.The nature of the procedure, as well as the risks, benefits, and alternatives were carefully and thoroughly reviewed with the patient by Nicoletta Ba, PA-C. Ample time for discussion and questions allowed. The patient understood, was satisfied, and agreed to proceed.   Alando Colleran, Lajuan Lines, MD

## 2021-08-16 ENCOUNTER — Telehealth: Payer: Self-pay | Admitting: Physician Assistant

## 2021-08-16 NOTE — Telephone Encounter (Signed)
Patient called and change her procedure date to 3/24 at 10:30 a.m.  Her prior procedure was at 4 p.m.  Can you please send her instructions to reflect the new time?  Thank you.

## 2021-08-16 NOTE — Telephone Encounter (Signed)
New instructions printed and mailed.

## 2021-08-20 ENCOUNTER — Encounter: Payer: Medicare Other | Admitting: Internal Medicine

## 2021-10-01 ENCOUNTER — Encounter: Payer: Medicare Other | Admitting: Internal Medicine

## 2022-02-28 ENCOUNTER — Other Ambulatory Visit: Payer: Self-pay | Admitting: Otolaryngology

## 2022-02-28 DIAGNOSIS — R221 Localized swelling, mass and lump, neck: Secondary | ICD-10-CM

## 2022-03-15 ENCOUNTER — Ambulatory Visit
Admission: RE | Admit: 2022-03-15 | Discharge: 2022-03-15 | Disposition: A | Discharge status: Home / Self Care | Payer: Medicare Other | Source: Ambulatory Visit | Attending: Otolaryngology | Admitting: Otolaryngology

## 2022-03-15 DIAGNOSIS — R221 Localized swelling, mass and lump, neck: Secondary | ICD-10-CM

## 2022-03-15 MED ORDER — IOPAMIDOL (ISOVUE-300) INJECTION 61%
75.0000 mL | Freq: Once | INTRAVENOUS | Status: AC | PRN
  Administered 2022-03-15: 75 mL via INTRAVENOUS

## 2022-04-28 ENCOUNTER — Ambulatory Visit (INDEPENDENT_AMBULATORY_CARE_PROVIDER_SITE_OTHER): Payer: Medicare Other | Admitting: Internal Medicine

## 2022-04-28 ENCOUNTER — Encounter: Payer: Self-pay | Admitting: Internal Medicine

## 2022-04-28 VITALS — BP 138/84 | HR 84 | Temp 97.2°F | Ht 62.0 in | Wt 152.0 lb

## 2022-04-28 DIAGNOSIS — I2510 Atherosclerotic heart disease of native coronary artery without angina pectoris: Secondary | ICD-10-CM

## 2022-04-28 DIAGNOSIS — T7840XA Allergy, unspecified, initial encounter: Secondary | ICD-10-CM | POA: Insufficient documentation

## 2022-04-28 DIAGNOSIS — K529 Noninfective gastroenteritis and colitis, unspecified: Secondary | ICD-10-CM

## 2022-04-28 DIAGNOSIS — K219 Gastro-esophageal reflux disease without esophagitis: Secondary | ICD-10-CM | POA: Diagnosis not present

## 2022-04-28 DIAGNOSIS — E785 Hyperlipidemia, unspecified: Secondary | ICD-10-CM

## 2022-04-28 DIAGNOSIS — J329 Chronic sinusitis, unspecified: Secondary | ICD-10-CM | POA: Insufficient documentation

## 2022-04-28 DIAGNOSIS — R350 Frequency of micturition: Secondary | ICD-10-CM

## 2022-04-28 DIAGNOSIS — J324 Chronic pansinusitis: Secondary | ICD-10-CM | POA: Diagnosis not present

## 2022-04-28 DIAGNOSIS — I10 Essential (primary) hypertension: Secondary | ICD-10-CM | POA: Diagnosis not present

## 2022-04-28 DIAGNOSIS — G4733 Obstructive sleep apnea (adult) (pediatric): Secondary | ICD-10-CM

## 2022-04-28 DIAGNOSIS — F39 Unspecified mood [affective] disorder: Secondary | ICD-10-CM

## 2022-04-28 DIAGNOSIS — J301 Allergic rhinitis due to pollen: Secondary | ICD-10-CM

## 2022-04-28 LAB — POC URINALSYSI DIPSTICK (AUTOMATED)
Bilirubin, UA: NEGATIVE
Blood, UA: NEGATIVE
Glucose, UA: NEGATIVE
Ketones, UA: NEGATIVE
Leukocytes, UA: NEGATIVE
Nitrite, UA: NEGATIVE
Protein, UA: NEGATIVE
Spec Grav, UA: 1.015 (ref 1.010–1.025)
Urobilinogen, UA: 0.2 E.U./dL
pH, UA: 6 (ref 5.0–8.0)

## 2022-04-28 NOTE — Assessment & Plan Note (Signed)
Mild in the past Discussed statin---will hold off for now

## 2022-04-28 NOTE — Addendum Note (Signed)
Addended by: Pilar Grammes on: 04/28/2022 02:40 PM   Modules accepted: Orders

## 2022-04-28 NOTE — Assessment & Plan Note (Signed)
Uses ASA 81 mg sporadically

## 2022-04-28 NOTE — Assessment & Plan Note (Signed)
Mild anxiety which is chronic ?related to referral to cardiologist? No meds (other than the metoprolol for the heart)

## 2022-04-28 NOTE — Assessment & Plan Note (Signed)
Mild No CPAP

## 2022-04-28 NOTE — Assessment & Plan Note (Signed)
This is controlled with pantoprazole '40mg'$  daily

## 2022-04-28 NOTE — Assessment & Plan Note (Addendum)
BP Readings from Last 3 Encounters:  04/28/22 138/84  08/13/21 (!) 148/86  03/06/18 (!) 152/65   Good control on amlodipine 2.5/benazapril 10 Metoprolol 12.5 (told for rapid heart--but she has not felt it) Recent labs okay--will hold off on checking

## 2022-04-28 NOTE — Progress Notes (Signed)
Subjective:    Patient ID: Sara Baker, female    DOB: 1941/06/04, 81 y.o.   MRN: [id number]  HPI Here to establish care Lives right here in [place] records from neurologist  Known HTN--on rx Did see cardiologist for chest pain in the past and palpitations Related to anxiety?? Is on the metoprolol due to this Also on furosemide every other day--but no edema  Is on PPI for GERD No dysphagia  Hands stay cold And feet No color changes No pain  Goes to gym 5 days per week--cardio and weights  Some urinary issues Feels the flow is slow at times--no incontinence Does empty okay  Current Outpatient Medications on File Prior to Visit  Medication Sig Dispense Refill   amlodipine-benazepril (LOTREL) 2.5-10 MG capsule Take 1 capsule by mouth at bedtime.     Calcium-Magnesium-Zinc 1000-400-15 MG TABS Take 1 tablet by mouth daily.     Cholecalciferol (VITAMIN D3) 1000 UNITS CAPS Take 1,000 Units by mouth daily.     Cyanocobalamin (VITAMIN B 12 PO) Take 1,000 mg by mouth daily.     Flaxseed, Linseed, (FLAX SEED OIL PO) Take by mouth daily. Pt. Not sure of dose but maybe '400mg'$ .     fluticasone (FLONASE) 50 MCG/ACT nasal spray 1 spray daily as needed.     furosemide (LASIX) 20 MG tablet Take 20 mg by mouth daily.     Magnesium Hydroxide (DULCOLAX PO) Take 1 tablet by mouth as needed.     metoprolol succinate (TOPROL-XL) 50 MG 24 hr tablet Take 12.5 mg by mouth daily.     pantoprazole (PROTONIX) 40 MG tablet Take 40 mg by mouth daily.     Polyethyl Glycol-Propyl Glycol (SYSTANE FREE OP) Apply 1 drop to eye as needed. For Dry Eyes.     Sennosides (EX-LAX PO) Take 1 tablet by mouth as needed.     No current facility-administered medications on file prior to visit.    Allergies  Allergen Reactions   Medrol [Methylprednisolone] Other (See Comments)    Increase blood pressure     Past Medical History:  Diagnosis Date   Anxiety    Arthritis    fingers, knee - otc  meds prn   ASCVD (arteriosclerotic cardiovascular disease)    mild microvascular changes   Asthma    Blood transfusion 10 + yrs   Wash DC   Cataract    Chronic seasonal allergic rhinitis due to pollen    Chronic sinusitis    Colitis    Vs IBS   GERD (gastroesophageal reflux disease)    Hyperlipidemia    Hypertension    Hypothyroidism    OSA (obstructive sleep apnea)    AHI 11   SVD (spontaneous vaginal delivery)    x 5   Vestibular migraine     Past Surgical History:  Procedure Laterality Date   ABDOMINAL HYSTERECTOMY  07/05/2012   Procedure: HYSTERECTOMY ABDOMINAL;  Surgeon: Melina Schools, MD;  Location: [facility];  Service: Gynecology;  Laterality: N/A;   BREAST SURGERY  1968   benign tumor   CATARACT EXTRACTION W/ INTRAOCULAR LENS  IMPLANT, BILATERAL  2011   CATARACT EXTRACTION W/ INTRAOCULAR LENS & ANTERIOR VITRECTOMY Bilateral    COLONOSCOPY     DILATION AND CURETTAGE OF UTERUS  2013   POLYPECTOMY     SALPINGOOPHORECTOMY  07/05/2012   Procedure: SALPINGO OOPHORECTOMY;  Surgeon: Melina Schools, MD;  Location: [facility];  Service: Gynecology;  Laterality: Bilateral;  THYROID SURGERY  2007   on parathyroids   TUBAL LIGATION  1982    Family History  Problem Relation Age of Onset   Hypertension Mother    Stroke Mother        hemorrhage   Hypertension Father    Stroke Father    Kidney disease Sister        ESRD--dialysis   Hypertension Sister    Hypertension Brother    Colon polyps Son    Colon cancer Neg Hx    Esophageal cancer Neg Hx    Rectal cancer Neg Hx    Stomach cancer Neg Hx    Pancreatic cancer Neg Hx     Social History   Socioeconomic History   Marital status: Widowed    Spouse name: Not on file   Number of children: 5   Years of education: Not on file   Highest education level: Not on file  Occupational History   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, museum    Comment: Retired  Tobacco Use   Smoking status: Never    Passive exposure: Never    Smokeless tobacco: Never  Vaping Use   Vaping Use: Never used  Substance and Sexual Activity   Alcohol use: No   Drug use: No   Sexual activity: Yes    Birth control/protection: Surgical  Other Topics Concern   Not on file  Social History Narrative   Widowed 2002   2 sons, 3 daughters      No living will   Daughter Mariann Laster should make medical decisions   Would accept resuscitation   Would accept tube feeds   Social Determinants of Health   Financial Resource Strain: Not on file  Food Insecurity: Not on file  Transportation Needs: Not on file  Physical Activity: Not on file  Stress: Not on file  Social Connections: Not on file  Intimate Partner Violence: Not on file   Review of Systems  Constitutional:        Lost 20# with exercise---now stable Mild "slow' feeling at times Wears seat belt  HENT:  Negative for hearing loss.        Top denture---partial bottom  Respiratory:  Negative for cough, chest tightness and shortness of breath.   Cardiovascular:  Negative for chest pain, palpitations and leg swelling.  Gastrointestinal:  Negative for blood in stool and constipation.  Genitourinary:  Negative for dysuria and hematuria.  Musculoskeletal:  Positive for back pain. Negative for arthralgias and joint swelling.  Skin:        Some skin tags  Neurological:  Negative for dizziness, syncope and light-headedness.  Psychiatric/Behavioral:         Some sleep problems lately Some anxiety in the past---better lately No real depression or anhedonia though       Objective:   Physical Exam Constitutional:      Appearance: Normal appearance.  HENT:     Mouth/Throat:     Pharynx: No oropharyngeal exudate or posterior oropharyngeal erythema.  Cardiovascular:     Rate and Rhythm: Normal rate and regular rhythm.     Pulses: Normal pulses.     Heart sounds: No murmur heard.    No gallop.  Pulmonary:     Effort: Pulmonary effort is normal.     Breath sounds: Normal breath  sounds. No wheezing or rales.  Abdominal:     Palpations: Abdomen is soft.     Tenderness: There is no abdominal tenderness.  Musculoskeletal:  Cervical back: Neck supple.     Right lower leg: No edema.     Left lower leg: No edema.  Lymphadenopathy:     Cervical: No cervical adenopathy.  Skin:    Findings: No rash.  Neurological:     Mental Status: She is alert.  Psychiatric:        Mood and Affect: Mood normal.        Behavior: Behavior normal.            Assessment & Plan:

## 2022-09-07 ENCOUNTER — Emergency Department: Payer: Medicare Other

## 2022-09-07 ENCOUNTER — Emergency Department
Admission: EM | Admit: 2022-09-07 | Discharge: 2022-09-07 | Disposition: A | Discharge status: Home / Self Care | Payer: Medicare Other | Attending: Emergency Medicine | Admitting: Emergency Medicine

## 2022-09-07 ENCOUNTER — Encounter: Payer: Self-pay | Admitting: Emergency Medicine

## 2022-09-07 DIAGNOSIS — R0789 Other chest pain: Secondary | ICD-10-CM | POA: Diagnosis present

## 2022-09-07 DIAGNOSIS — R112 Nausea with vomiting, unspecified: Secondary | ICD-10-CM | POA: Diagnosis not present

## 2022-09-07 DIAGNOSIS — R1011 Right upper quadrant pain: Secondary | ICD-10-CM

## 2022-09-07 LAB — HEPATIC FUNCTION PANEL
ALT: 14 U/L (ref 0–44)
AST: 23 U/L (ref 15–41)
Albumin: 3.9 g/dL (ref 3.5–5.0)
Alkaline Phosphatase: 115 U/L (ref 38–126)
Bilirubin, Direct: 0.1 mg/dL (ref 0.0–0.2)
Indirect Bilirubin: 0.5 mg/dL (ref 0.3–0.9)
Total Bilirubin: 0.6 mg/dL (ref 0.3–1.2)
Total Protein: 6.9 g/dL (ref 6.5–8.1)

## 2022-09-07 LAB — CBC
HCT: 39.8 % (ref 36.0–46.0)
Hemoglobin: 12.9 g/dL (ref 12.0–15.0)
MCH: 28.2 pg (ref 26.0–34.0)
MCHC: 32.4 g/dL (ref 30.0–36.0)
MCV: 86.9 fL (ref 80.0–100.0)
Platelets: 216 10*3/uL (ref 150–400)
RBC: 4.58 MIL/uL (ref 3.87–5.11)
RDW: 14.1 % (ref 11.5–15.5)
WBC: 7.3 10*3/uL (ref 4.0–10.5)
nRBC: 0 % (ref 0.0–0.2)

## 2022-09-07 LAB — LIPASE, BLOOD: Lipase: 27 U/L (ref 11–51)

## 2022-09-07 LAB — BASIC METABOLIC PANEL
Anion gap: 10 (ref 5–15)
BUN: 11 mg/dL (ref 8–23)
CO2: 26 mmol/L (ref 22–32)
Calcium: 9.3 mg/dL (ref 8.9–10.3)
Chloride: 104 mmol/L (ref 98–111)
Creatinine, Ser: 0.98 mg/dL (ref 0.44–1.00)
GFR, Estimated: 58 mL/min — ABNORMAL LOW (ref >60)
Glucose, Bld: 145 mg/dL — ABNORMAL HIGH (ref 70–99)
Potassium: 3.1 mmol/L — ABNORMAL LOW (ref 3.5–5.1)
Sodium: 140 mmol/L (ref 135–145)

## 2022-09-07 LAB — TROPONIN I (HIGH SENSITIVITY)
Troponin I (High Sensitivity): 6 ng/L (ref <18)
Troponin I (High Sensitivity): 6 ng/L (ref <18)

## 2022-09-07 MED ORDER — SUCRALFATE 1 G PO TABS
1.0000 g | ORAL_TABLET | Freq: Once | ORAL | Status: AC
  Administered 2022-09-07: 1 g via ORAL
  Filled 2022-09-07: qty 1

## 2022-09-07 MED ORDER — IOHEXOL 300 MG/ML  SOLN
100.0000 mL | Freq: Once | INTRAMUSCULAR | Status: AC | PRN
  Administered 2022-09-07: 100 mL via INTRAVENOUS

## 2022-09-07 MED ORDER — ALUM & MAG HYDROXIDE-SIMETH 200-200-20 MG/5ML PO SUSP
30.0000 mL | Freq: Once | ORAL | Status: AC
  Administered 2022-09-07: 30 mL via ORAL
  Filled 2022-09-07: qty 30

## 2022-09-07 NOTE — Consult Note (Signed)
[facility] SURGICAL CONSULTATION NOTE (initial) - [id number]   HISTORY OF PRESENT ILLNESS (HPI):  82 y.o. female presented to [facility] ED overnight for evaluation of chest pain. However, when asked this morning, patient seems to note that she was having left sided abdominal pain that radiated up to her chest an back. She thought this "was gas pains." On chart review, it does seem she may have initially been having some chest discomfort which was radiating to her back and right side. When asked directly about RUQ pain she denied this. She did have an episode of nausea and emesis with this. No fever, chills, cough, SOB, urinary change, nor juandice. She reports getting Carafate and Maalox which helped. She denied any known history of cholelithiasis before today. No history of post-prandial RUQ pain. Only previous intra-abdominal surgery is an abdominal hysterectomy. Laboratory work up in the ED was grossly reassuring aside from mild hypokalemia to 3.1. She did get CT Abdomen/Pelvis which did show cholelithiasis in gallbladder neck and what seems to be wall thickening. RUQ [place] was also obtained and showed similar.  At the time of my evaluation (1030 AM), she reported that she feels completely better. No longer with any abdominal pain.   Surgery is consulted by emergency physician Dr. Arta Silence, MD in this context for evaluation and management of possible cholecystitis.   PAST MEDICAL HISTORY (PMH):  Past Medical History:  Diagnosis Date   Anxiety    Arthritis    fingers, knee - otc meds prn   ASCVD (arteriosclerotic cardiovascular disease)    mild microvascular changes   Asthma    Blood transfusion 10 + yrs   Wash DC   Cataract    Chronic seasonal allergic rhinitis due to pollen    Chronic sinusitis    Colitis    Vs IBS   GERD (gastroesophageal reflux disease)    Hyperlipidemia    Hypertension    Hypothyroidism    OSA (obstructive sleep apnea)    AHI 11   SVD  (spontaneous vaginal delivery)    x 5   Vestibular migraine      PAST SURGICAL HISTORY ([place]):  Past Surgical History:  Procedure Laterality Date   ABDOMINAL HYSTERECTOMY  07/05/2012   Procedure: HYSTERECTOMY ABDOMINAL;  Surgeon: Melina Schools, MD;  Location: [facility];  Service: Gynecology;  Laterality: N/A;   BREAST SURGERY  1968   benign tumor   CATARACT EXTRACTION W/ INTRAOCULAR LENS  IMPLANT, BILATERAL  2011   CATARACT EXTRACTION W/ INTRAOCULAR LENS & ANTERIOR VITRECTOMY Bilateral    COLONOSCOPY     DILATION AND CURETTAGE OF UTERUS  2013   POLYPECTOMY     SALPINGOOPHORECTOMY  07/05/2012   Procedure: SALPINGO OOPHORECTOMY;  Surgeon: Melina Schools, MD;  Location: [facility];  Service: Gynecology;  Laterality: Bilateral;   THYROID SURGERY  2007   on parathyroids   TUBAL LIGATION  1982     MEDICATIONS:  Prior to Admission medications   Medication Sig Start Date End Date Taking? Authorizing Provider  amlodipine-benazepril (LOTREL) 2.5-10 MG capsule Take 1 capsule by mouth at bedtime. 06/28/21   [provider]  Calcium-Magnesium-Zinc 1000-400-15 MG TABS Take 1 tablet by mouth daily.    [provider]  Cholecalciferol (VITAMIN D3) 1000 UNITS CAPS Take 1,000 Units by mouth daily.    [provider]  Cyanocobalamin (VITAMIN B 12 PO) Take 1,000 mg by mouth daily.    [provider]  Flaxseed, Linseed, (FLAX SEED OIL  PO) Take by mouth daily. Pt. Not sure of dose but maybe '400mg'$ .    [provider]  fluticasone (FLONASE) 50 MCG/ACT nasal spray 1 spray daily as needed.    [provider]  furosemide (LASIX) 20 MG tablet Take 20 mg by mouth daily. 12/14/17   [provider]  Magnesium Hydroxide (DULCOLAX PO) Take 1 tablet by mouth as needed.    [provider]  metoprolol succinate (TOPROL-XL) 50 MG 24 hr tablet Take 12.5 mg by mouth daily. 07/22/21   [provider]  pantoprazole (PROTONIX) 40 MG tablet Take 40 mg  by mouth daily. 01/12/22   [provider]  Polyethyl Glycol-Propyl Glycol (SYSTANE FREE OP) Apply 1 drop to eye as needed. For Dry Eyes.    [provider]  Sennosides (EX-LAX PO) Take 1 tablet by mouth as needed.    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, museum    Comment: Retired  Tobacco Use   Smoking status: Never    Passive exposure: Never   Smokeless tobacco: Never  Vaping Use   Vaping Use: Never used  Substance and Sexual Activity   Alcohol use: No   Drug use: No   Sexual activity: Yes    Birth control/protection: Surgical  Other Topics Concern   Not on file  Social History Narrative   Widowed 2002   2 sons, 3 daughters      No living will   Daughter Mariann Laster should make medical decisions   Would accept resuscitation   Would accept tube feeds   Social Determinants of Health   Financial Resource Strain: Not on file  Food Insecurity: Not on file  Transportation Needs: Not on file  Physical Activity: Not on file  Stress: Not on file  Social Connections: Not on file  Intimate Partner Violence: Not on file     FAMILY HISTORY:  Family History  Problem Relation Age of Onset   Hypertension Mother    Stroke Mother        hemorrhage   Hypertension Father    Stroke Father    Kidney disease Sister        ESRD--dialysis   Hypertension Sister    Hypertension Brother    Colon polyps Son    Colon cancer Neg Hx    Esophageal cancer Neg Hx    Rectal cancer Neg Hx    Stomach cancer Neg Hx    Pancreatic cancer Neg Hx       REVIEW OF SYSTEMS:  Review of Systems   Constitutional:  Negative for chills and fever.  HENT:  Negative for congestion and sore throat.   Respiratory:  Negative for cough and shortness of breath.   Cardiovascular:  Positive for chest pain (Resolved). Negative for palpitations.  Gastrointestinal:  Positive for abdominal pain (Resolved), nausea (Resolved) and vomiting (Resolved). Negative for constipation and diarrhea.  Genitourinary:  Negative for dysuria and urgency.  All other systems reviewed and are negative.   VITAL SIGNS:  Temp:  [98.7 F (37.1 C)-99 F (37.2 C)]  98.7 F (37.1 C) (02/28 0600) Pulse Rate:  [90-111] 100 (02/28 0730) Resp:  [10-18] 14 (02/28 0730) BP: (145-158)/(68-91) 148/91 (02/28 0730) SpO2:  [96 %-100 %] 98 % (02/28 0730) Weight:  [66.2 kg] 66.2 kg (02/28 0227)     Height: '5\' 4"'$  (162.6 cm) Weight: 66.2 kg BMI (Calculated): 25.05   INTAKE/OUTPUT:  No intake/output data recorded.  PHYSICAL EXAM:  Physical Exam Vitals and nursing note reviewed. Exam conducted with a chaperone present.  Constitutional:      General: She is not in acute distress.    Appearance: Normal appearance. She is normal weight. She is not ill-appearing.     Comments: Patient resting comfortably in bed; NAD; son at bedside   Eyes:     General: No scleral icterus.    Conjunctiva/sclera: Conjunctivae normal.  Cardiovascular:     Rate and Rhythm: Normal rate and regular rhythm.     Pulses: Normal pulses.     Heart sounds: No murmur heard. Pulmonary:     Effort: Pulmonary effort is normal. No respiratory distress.     Breath sounds: Normal breath sounds.  Abdominal:     General: Abdomen is flat. A surgical scar is present. There is no distension.     Palpations: Abdomen is soft.     Tenderness: There is no abdominal tenderness. There is no guarding or rebound. Negative signs include Murphy's sign.     Comments: Abdomen is soft, she has absolutely no tenderness with palpation, Murphy's Sign is grossly negative,  non-distended, no rebound/guarding   Genitourinary:    Comments: Deferred Musculoskeletal:     Right lower leg: No edema.     Left lower leg: No edema.  Skin:    General: Skin is warm and dry.     Findings: No bruising.  Neurological:     General: No focal deficit present.     Mental Status: She is alert and oriented to person, place, and time.  Psychiatric:        Mood and Affect: Mood normal.        Behavior: Behavior normal.      Labs:     Latest Ref Rng & Units 09/07/2022    2:31 AM 07/06/2012    5:40 AM 07/05/2012    8:42 PM  CBC  WBC 4.0 - 10.5 K/uL 7.3  14.9  12.1   Hemoglobin 12.0 - 15.0 g/dL 12.9  11.8  12.2   Hematocrit 36.0 - 46.0 % 39.8  35.6  36.9   Platelets 150 - 400 K/uL 216  210  183       Latest Ref Rng & Units 09/07/2022    2:31 AM 09/07/2022    2:29 AM 07/05/2012    1:00 PM  CMP  Glucose 70 - 99 mg/dL 145     BUN 8 - 23 mg/dL 11     Creatinine 0.44 - 1.00 mg/dL 0.98   0.96   Sodium 135 - 145 mmol/L 140     Potassium 3.5 - 5.1 mmol/L 3.1     Chloride 98 - 111 mmol/L 104     CO2 22 - 32 mmol/L 26     Calcium 8.9 - 10.3 mg/dL 9.3     Total Protein 6.5 - 8.1 g/dL  6.9    Total Bilirubin 0.3 - 1.2 mg/dL  0.6    Alkaline Phos 38 - 126 U/L  115    AST 15 - 41 U/L  23    ALT 0 -  44 U/L  14       Imaging studies:   CT Abdomen/Pelvis (09/07/2022) personally reviewed which does show cholelithiasis in neck of the gallbladder and likely wall thickening, no free air, no other intra-abdominal findings, and radiologist report reviewed below:  IMPRESSION: Cholelithiasis and findings of cholecystitis, please correlate with right upper quadrant exam.   RUQ [place] (09/07/2022) personally reviewed, again with cholelithiasis and likely wall thickening, and radiologist report reviewed below:  IMPRESSION: Constellation of findings consistent with acute cholecystitis, except for sonographic Murphy sign-please correlate with preceding pain  medication.   Assessment/Plan: (ICD-10's: R10.11) 82 y.o. female presenting with abdominal pain which is know resolved otherwise incidentally found to have gallbladder changes concerning for possible cholecystitis. However, her symptoms have completely resolved, her abdominal examination is benign with negative Murphy's sign, and her WBC and LFTs are completely normal. At this time, I do not think she has cholecystitis and these gallbladder changes are incidental findings and may be chronic in nature. Interestingly, she denied any history of RUQ post-prandial pain or history of "gallbladder attacks" in the past. I do not think there is any indication for admission and cholecystectomy at this time. I do think it is reasonable to follow up as outpatient in 1-2 weeks and reassess and determine interest and need for elective cholecystectomy. Patient, and her son at bedside, are understanding of this and very agreeable with this plan. I did review dietary recommendations (ie: low fat diet) to limit risk of recurrence, and patient notes she is a vegetarian already and does not consume much fat. Additionally, I reviewed signs and symptoms of cholecystitis and possible complications including, return of severe RUQ pain, triggered or exacerbated with eating, fever, nausea, emesis, juandice, acholic stools. She, and her son, understand to return to the ED should this develop. They were in agreement with this plan. Will be discharged home. All questions and concerns were addressed and answered. Discussed with EDP as well.   Thank you for the opportunity to participate in this patient's care.   -- Edison Simon, PA-C [facility] 09/07/2022, 10:33 AM M-F: 7am - 4pm

## 2022-09-07 NOTE — ED Notes (Signed)
Pt assisted to the bathroom.

## 2022-09-07 NOTE — ED Triage Notes (Signed)
Pt presents via POV with complaints of midsternal CP with radiates around her R side to her upper back that started last night. Rates the pain 6/10 - attempted to take pain medications without improvement in sx. Hx of colitis - attempted to take metamucil but had an episode of emesis. Denies fevers, chills, cough,  SOB.

## 2022-09-07 NOTE — ED Provider Notes (Signed)
-----------------------------------------   9:05 AM on 09/07/2022 -----------------------------------------   Took over care of this patient from Dr. Karma Greaser.  Ultrasound confirms findings of acute cholecystitis open on reassessment the patient continues to have no significant pain at this time.  I consulted and discussed the case with Dr. Christian Mate and PA Elissa Hefty from general surgery.  His team will evaluate the patient to determine disposition.  ----------------------------------------- 10:32 AM on 09/07/2022 -----------------------------------------  The patient has been evaluated by PA Delena Bali from surgery.  The patient continues to have no pain or tenderness.  Lab workup is unremarkable.  Therefore the recommendation from surgery is that the patient is appropriate for discharge home with close outpatient follow-up.  She does not require cholecystectomy at this time.  We gave the patient strict return precautions.   Arta Silence, MD 09/07/22 [phone]

## 2022-09-07 NOTE — ED Notes (Signed)
Pt brought to ed rm 26 at this time, this RN now assuming care.

## 2022-09-07 NOTE — Discharge Instructions (Signed)
Follow-up with the surgeon as instructed.  Return to the ER for new, worsening, persistent severe chest, abdominal, or flank pain, fever, vomiting, or any other new or worsening symptoms that concern you.

## 2022-09-07 NOTE — ED Notes (Signed)
Report given to Emily RN

## 2022-09-07 NOTE — ED Provider Notes (Signed)
[facility] Provider Note    Event Date/Time   First MD Initiated Contact with Patient 09/07/22 [phone]     (approximate)   History   Chest Pain   HPI  MAHDIA KOR is a 82 y.o. female with no cardiac or pulmonary history who presents for evaluation of acute onset pain in the middle of her chest that radiates around to her right side and upper back.  She said it started at rest earlier in the evening, probably around 5 PM.  It did not occur after eating anything; she had not had anything to eat since about 11 AM.  She reports that it got better got worse but it has been persistent and was severe at times.  Feels better now.  She has had some nausea and 1 episode of vomiting.  No lower abdominal pain.  She does not feel that she has pain in the upper abdomen, just in the middle of her chest.  No shortness of breath.  She denies fever.     Physical Exam   Triage Vital Signs: ED Triage Vitals  Enc Vitals Group     BP 09/07/22 0229 (!) 158/88     Pulse Rate 09/07/22 0229 (!) 111     Resp 09/07/22 0229 18     Temp 09/07/22 0229 99 F (37.2 C)     Temp Source 09/07/22 0229 Oral     SpO2 09/07/22 0229 98 %     Weight 09/07/22 0227 66.2 kg (146 lb)     Height 09/07/22 0227 1.626 m ('5\' 4"'$ )     Head Circumference --      Peak Flow --      Pain Score 09/07/22 0227 6     Pain Loc --      Pain Edu? --      Excl. in [place]? --     Most recent vital signs: Vitals:   09/07/22 0600 09/07/22 0700  BP: (!) 152/80 (!) 156/88  Pulse: (!) 101 93  Resp: 11 10  Temp: 98.7 F (37.1 C)   SpO2: 98% 98%     General: Awake, no distress.  Elderly but awake, alert, sharp, and conversant. CV:  Good peripheral perfusion.  Heart sounds.  Regular rhythm, occasionally borderline tachycardia but not consistently Resp:  Normal effort. Speaking easily and comfortably, no accessory muscle usage nor intercostal retractions.  Lungs are clear to auscultation. Abd:  No distention.  No  tenderness to palpation in the epigastrium, right upper quadrant (with negative Murphy sign), and no lower abdominal tenderness.   ED Results / Procedures / Treatments   Labs (all labs ordered are listed, but only abnormal results are displayed) Labs Reviewed  BASIC METABOLIC PANEL - Abnormal; Notable for the following components:      Result Value   Potassium 3.1 (*)    Glucose, Bld 145 (*)    GFR, Estimated 58 (*)    All other components within normal limits  CBC  HEPATIC FUNCTION PANEL  LIPASE, BLOOD  TROPONIN I (HIGH SENSITIVITY)  TROPONIN I (HIGH SENSITIVITY)     EKG  ED ECG REPORT I, Hinda Kehr, the attending physician, personally viewed and interpreted this ECG.  Date: 09/07/2022 EKG Time: 2:32 AM Rate: 111 Rhythm: Sinus tachycardia QRS Axis: normal Intervals: normal ST/T Wave abnormalities: Inverted T waves in lead III, otherwise unremarkable narrative Interpretation: no definitive evidence of acute ischemia; does not meet STEMI criteria.    RADIOLOGY I viewed  and interpreted the patient's two-view chest x-ray.  I see no evidence of pneumonia, widened mediastinum, nor other acute abnormality.  I also read the radiologist's report, which confirmed no acute findings.    PROCEDURES:  Critical Care performed: No  Procedures   MEDICATIONS ORDERED IN ED: Medications  alum & mag hydroxide-simeth (MAALOX/MYLANTA) 200-200-20 MG/5ML suspension 30 mL (30 mLs Oral Given 09/07/22 0533)  sucralfate (CARAFATE) tablet 1 g (1 g Oral Given 09/07/22 0532)  iohexol (OMNIPAQUE) 300 MG/ML solution 100 mL (100 mLs Intravenous Contrast Given 09/07/22 0546)     IMPRESSION / MDM / ASSESSMENT AND PLAN / ED COURSE  I reviewed the triage vital signs and the nursing notes.                              Differential diagnosis includes, but is not limited to, ACS, biliary colic, acid reflux, pneumonia, PE.  Patient's presentation is most consistent with acute presentation with  potential threat to life or bodily function.  Labs/studies ordered: BMP, CBC, hepatic function panel, lipase, high-sensitivity troponin x 2, two-view chest x-ray, CT abdomen/pelvis, right upper quadrant ultrasound.  EKG. Interventions/Medications given: Maalox 30 mL, Carafate 1 g p.o. [facility] Course my include additional interventions or labs/studies not listed above.)  Patient is generally well-appearing with stable vitals although occasionally she will become slightly tachycardic.  No evidence of ischemia on EKG.  The patient's lab work is all essentially normal other than mild hypokalemia.    She told me that she suffers from acid reflux and that this feels a little bit like that but worse.  She cannot remember exactly what she takes.  She had some relief with the Maalox and the Carafate.  However, she is concerned because she has had bowel problems in the past and wants to make sure it is not her colitis.  Given her age, I agreed to proceed with a CT scan of the abdomen and pelvis even though she does not have significant tenderness to palpation.  My initial concern for biliary colic is made less likely by normal LFTs, normal lipase, and no tenderness to the right upper quadrant with negative Murphy sign.  The patient is on the cardiac monitor to evaluate for evidence of arrhythmia and/or significant heart rate changes.   Clinical Course as of 09/07/22 0748  Wed Sep 07, 2022  0618 CT ABDOMEN PELVIS W CONTRAST I viewed and interpreted the patient's CT scan of the abdomen and pelvis.  There is some apparent inflammation around the gallbladder.  The radiologist is concerned this may represent cholecystitis and suggested clinical correlation.  There are no other acute findings in the abdomen or pelvis.  I reassessed the patient and she says she feels fine with no persistent pain.  I palpated her carefully again and she continues to have no tenderness in the epigastrium or right upper quadrant  with negative Murphy sign, simply says "it is a little bit uncomfortable".  I think it is unlikely she has cholecystitis but we will further assess with an ultrasound.  If the patient's ultrasound is normal or indicative only of gallstones, I anticipate she will be discharged for outpatient follow-up.  If there is a suggestion of cholecystitis, obviously she will need a surgical consult. [CF]  [id number] Transferred ED care to Dr. Cherylann Banas to follow up on ultrasound and consult surgery if necessary. [CF]    Clinical Course User Index [CF] Hinda Kehr, MD  FINAL CLINICAL IMPRESSION(S) / ED DIAGNOSES   Final diagnoses:  RUQ pain  Atypical chest pain  Nausea and vomiting, unspecified vomiting type     Rx / DC Orders   ED Discharge Orders     None        Note:  This document was prepared using Dragon voice recognition software and may include unintentional dictation errors.   Hinda Kehr, MD 09/07/22 [phone]

## 2022-09-08 ENCOUNTER — Telehealth: Payer: Self-pay

## 2022-09-08 NOTE — Transitions of Care (Post Inpatient/ED Visit) (Signed)
   09/08/2022  Name: Sara Baker MRN: [id number] DOB: [birth year]  Today's TOC FU Call Status: Today's TOC FU Call Status:: Successful TOC FU Call Competed TOC FU Call Complete Date: 09/08/22  Transition Care Management Follow-up Telephone Call Date of Discharge: 09/07/22 Discharge Facility: [facility]) Type of Discharge: Emergency Department Reason for ED Visit: Other: (RUQ pain) How have you been since you were released from the hospital?: Better Any questions or concerns?: No  Items Reviewed: Did you receive and understand the discharge instructions provided?: Yes Medications obtained and verified?: Yes (Medications Reviewed) Any new allergies since your discharge?: No Dietary orders reviewed?: Yes Do you have support at home?: Yes  Home Care and Equipment/Supplies: [place] Ordered?: No Any new equipment or medical supplies ordered?: No  Functional Questionnaire: Do you need assistance with bathing/showering or dressing?: No Do you need assistance with meal preparation?: No Do you need assistance with eating?: No Do you have difficulty maintaining continence: No Do you need assistance with getting out of bed/getting out of a chair/moving?: No Do you have difficulty managing or taking your medications?: No  Folllow up appointments reviewed: PCP Follow-up appointment confirmed?: No (specialist) MD Provider Line [phone] Given: Yes [facility] Follow-up appointment confirmed?: Yes Date of Specialist follow-up appointment?: 09/20/22 Follow-Up Specialty Provider:: Dr Christian Mate Do you need transportation to your follow-up appointment?: No Do you understand care options if your condition(s) worsen?: Yes-patient verbalized understanding    Clay LPN [facility] [phone]

## 2022-09-19 NOTE — Progress Notes (Unsigned)
Patient ID: Sara Baker, female   DOB: 1940-12-17, 82 y.o.   MRN: [id number]  Chief Complaint:  Epigastric pain  History of Present Illness Sara Baker is a 82 y.o. female with a severe episode of epigastric pain following an episode of overindulging and onion rings.  The pain radiated to the back, was associated with vomiting.  She has a history of constipation.  Denies any history of jaundice or recurring right upper quadrant pain.  She has not had an episode since, she reports she had been previously placed on a fried food avoidance diet years ago.  No history of hepatitis.   She reports that she improved dramatically in the emergency room, and elected to defer consideration of surgery at that time.  Past Medical History Past Medical History:  Diagnosis Date   Anxiety    Arthritis    fingers, knee - otc meds prn   ASCVD (arteriosclerotic cardiovascular disease)    mild microvascular changes   Asthma    Blood transfusion 10 + yrs   Wash DC   Cataract    Chronic seasonal allergic rhinitis due to pollen    Chronic sinusitis    Colitis    Vs IBS   GERD (gastroesophageal reflux disease)    Hyperlipidemia    Hypertension    Hypothyroidism    OSA (obstructive sleep apnea)    AHI 11   SVD (spontaneous vaginal delivery)    x 5   Vestibular migraine       Past Surgical History:  Procedure Laterality Date   ABDOMINAL HYSTERECTOMY  07/05/2012   Procedure: HYSTERECTOMY ABDOMINAL;  Surgeon: Melina Schools, MD;  Location: [facility];  Service: Gynecology;  Laterality: N/A;   BREAST SURGERY  1968   benign tumor   CATARACT EXTRACTION W/ INTRAOCULAR LENS  IMPLANT, BILATERAL  2011   CATARACT EXTRACTION W/ INTRAOCULAR LENS & ANTERIOR VITRECTOMY Bilateral    COLONOSCOPY     DILATION AND CURETTAGE OF UTERUS  2013   POLYPECTOMY     SALPINGOOPHORECTOMY  07/05/2012   Procedure: SALPINGO OOPHORECTOMY;  Surgeon: Melina Schools, MD;  Location: [facility];  Service: Gynecology;  Laterality: Bilateral;    THYROID SURGERY  2007   on parathyroids   TUBAL LIGATION  1982    Allergies  Allergen Reactions   Medrol [Methylprednisolone] Other (See Comments)    Increase blood pressure     Current Outpatient Medications  Medication Sig Dispense Refill   amlodipine-benazepril (LOTREL) 2.5-10 MG capsule Take 1 capsule by mouth at bedtime.     Calcium-Magnesium-Zinc 1000-400-15 MG TABS Take 1 tablet by mouth daily.     Cholecalciferol (VITAMIN D3) 1000 UNITS CAPS Take 1,000 Units by mouth daily.     Cyanocobalamin (VITAMIN B 12 PO) Take 1,000 mg by mouth daily.     Flaxseed, Linseed, (FLAX SEED OIL PO) Take by mouth daily. Pt. Not sure of dose but maybe '400mg'$ .     fluticasone (FLONASE) 50 MCG/ACT nasal spray 1 spray daily as needed.     furosemide (LASIX) 20 MG tablet Take 20 mg by mouth daily.     KLOR-CON M20 20 MEQ tablet Take 20 mEq by mouth daily.     Magnesium Hydroxide (DULCOLAX PO) Take 1 tablet by mouth as needed.     pantoprazole (PROTONIX) 40 MG tablet Take 40 mg by mouth daily.     Polyethyl Glycol-Propyl Glycol (SYSTANE FREE OP) Apply 1 drop to eye as needed. For Dry Eyes.  Sennosides (EX-LAX PO) Take 1 tablet by mouth as needed.     solifenacin (VESICARE) 10 MG tablet Take by mouth daily.     No current facility-administered medications for this visit.    Family History Family History  Problem Relation Age of Onset   Hypertension Mother    Stroke Mother        hemorrhage   Hypertension Father    Stroke Father    Kidney disease Sister        ESRD--dialysis   Hypertension Sister    Hypertension Brother    Colon polyps Son    Colon cancer Neg Hx    Esophageal cancer Neg Hx    Rectal cancer Neg Hx    Stomach cancer Neg Hx    Pancreatic cancer Neg Hx       Social History Social History   Tobacco Use   Smoking status: Never    Passive exposure: Never   Smokeless tobacco: Never  Vaping Use   Vaping Use: Never used  Substance Use Topics   Alcohol use: No    Drug use: No        Review of Systems  Constitutional: Negative.   HENT:  Positive for tinnitus.   Eyes: Negative.   Respiratory: Negative.    Cardiovascular: Negative.   Gastrointestinal:  Positive for abdominal pain, constipation, nausea and vomiting.  Genitourinary:  Positive for frequency.  Skin: Negative.   Neurological: Negative.   Psychiatric/Behavioral: Negative.       Physical Exam Blood pressure (!) 153/89, pulse 94, temperature 98.1 F (36.7 C), temperature source Oral, height '5\' 4"'$  (1.626 m), weight 147 lb (66.7 kg), SpO2 99 %. Last Weight  Most recent update: 09/20/2022  8:46 AM    Weight  66.7 kg (147 lb)             CONSTITUTIONAL: Well developed, and nourished, appropriately responsive and aware without distress.   EYES: Sclera non-icteric.   EARS, NOSE, MOUTH AND THROAT:  The oropharynx is clear. Oral mucosa is pink and moist.    Hearing is intact to voice.  NECK: Trachea is midline, and there is no jugular venous distension.  LYMPH NODES:  Lymph nodes in the neck are not appreciated. RESPIRATORY:  Lungs are clear, and breath sounds are equal bilaterally.  Normal respiratory effort without pathologic use of accessory muscles. CARDIOVASCULAR: Heart is regular in rate and rhythm.   Well perfused.  GI: The abdomen is soft, nontender, and nondistended. There were no palpable masses.  I did not appreciate hepatosplenomegaly. MUSCULOSKELETAL:  Symmetrical muscle tone appreciated in all four extremities.    SKIN: Skin turgor is normal. No pathologic skin lesions appreciated.  NEUROLOGIC:  Motor and sensation appear grossly normal.  Cranial nerves are grossly without defect. PSYCH:  Alert and oriented to person, place and time. Affect is appropriate for situation.  Data Reviewed I have personally reviewed what is currently available of the patient's imaging, recent labs and medical records.   Labs:     Latest Ref Rng & Units 09/07/2022    2:31 AM 07/06/2012     5:40 AM 07/05/2012    8:42 PM  CBC  WBC 4.0 - 10.5 K/uL 7.3  14.9  12.1   Hemoglobin 12.0 - 15.0 g/dL 12.9  11.8  12.2   Hematocrit 36.0 - 46.0 % 39.8  35.6  36.9   Platelets 150 - 400 K/uL 216  210  183       Latest Ref  Rng & Units 09/07/2022    2:31 AM 09/07/2022    2:29 AM 07/05/2012    1:00 PM  CMP  Glucose 70 - 99 mg/dL 145     BUN 8 - 23 mg/dL 11     Creatinine 0.44 - 1.00 mg/dL 0.98   0.96   Sodium 135 - 145 mmol/L 140     Potassium 3.5 - 5.1 mmol/L 3.1     Chloride 98 - 111 mmol/L 104     CO2 22 - 32 mmol/L 26     Calcium 8.9 - 10.3 mg/dL 9.3     Total Protein 6.5 - 8.1 g/dL  6.9    Total Bilirubin 0.3 - 1.2 mg/dL  0.6    Alkaline Phos 38 - 126 U/L  115    AST 15 - 41 U/L  23    ALT 0 - 44 U/L  14        Imaging: Radiological images reviewed:  CLINICAL DATA:  Upper abdominal versus chest pain   EXAM: ULTRASOUND ABDOMEN LIMITED RIGHT UPPER QUADRANT   COMPARISON:  Abdominal CT from earlier today   FINDINGS: Gallbladder:   Distended gallbladder with stone fixed at the neck. There is wall thickening and pericholecystic fluid although no reported Murphy sign.   Common bile duct:   Diameter: 6 mm   Liver:   Scattered liver cysts. No worrisome lesion identified. Within normal limits in parenchymal echogenicity. Portal vein is patent on color Doppler imaging with normal direction of blood flow towards the liver.   IMPRESSION: Constellation of findings consistent with acute cholecystitis, except for sonographic Murphy sign-please correlate with preceding pain medication.     Electronically Signed   By: Jorje Guild M.D.   On: 09/07/2022 07:43 Within last 24 hrs: No results found. CLINICAL DATA:  Acute, nonlocalized abdominal pain   EXAM: CT ABDOMEN AND PELVIS WITH CONTRAST   TECHNIQUE: Multidetector CT imaging of the abdomen and pelvis was performed using the standard protocol following bolus administration of intravenous contrast.    RADIATION DOSE REDUCTION: This exam was performed according to the departmental dose-optimization program which includes automated exposure control, adjustment of the mA and/or kV according to patient size and/or use of iterative reconstruction technique.   CONTRAST:  181m OMNIPAQUE IOHEXOL 300 MG/ML  SOLN   COMPARISON:  None Available.   FINDINGS: Lower chest:  No contributory findings.   Hepatobiliary: Scattered liver cysts measuring up to 2.1 cm.Thick walled gallbladder containing calculi with stone at the neck. The gallbladder appears distended.   Pancreas: Generalized atrophy.   Spleen: Unremarkable.   Adrenals/Urinary Tract: Negative adrenals. No hydronephrosis or stone. Subcentimeter bilateral renal cysts. No imaging follow-up recommended. Unremarkable bladder.   Stomach/Bowel:  No obstruction. No appendicitis.   Vascular/Lymphatic: No acute vascular abnormality. Ordinary atheromatous change. No mass or adenopathy.   Reproductive:Hysterectomy.   Other: No ascites or pneumoperitoneum.   Musculoskeletal: No acute abnormalities. Generalized lumbar spine degeneration with mild L4-5 anterolisthesis. L4-5 spinal stenosis that is high-grade.   IMPRESSION: Cholelithiasis and findings of cholecystitis, please correlate with right upper quadrant exam.     Electronically Signed   By: JJorje GuildM.D.   On: 09/07/2022 06:10 Assessment    Chronic calculus cholecystitis with biliary colic  Patient Active Problem List   Diagnosis Date Noted   Endometrial hyperplasia 09/20/2022   Menopausal symptom [id number]  Umbilical hernia [id number]  Vaginal lesion 09/20/2022   RUQ pain 09/07/2022   Hypertension 04/28/2022  Hyperlipidemia 04/28/2022   GERD (gastroesophageal reflux disease) 04/28/2022   Chronic sinusitis 04/28/2022   Chronic seasonal allergic rhinitis due to pollen 04/28/2022   Colitis 04/28/2022   ASCVD (arteriosclerotic cardiovascular disease)  04/28/2022   Mood disorder ([place]) 04/28/2022   Multiple adenomatous polyps 08/13/2021    Plan    She conveniently has a visit with her cardiologist tomorrow.  Will request cardiac clearance to consider elective general anesthetic and robotic cholecystectomy.  This was discussed thoroughly.  Optimal plan is for robotic cholecystectomy utilizing ICG imaging. Risks and benefits have been discussed with the patient which include but are not limited to anesthesia, bleeding, infection, biliary ductal injury, resulting in leak or stenosis, other associated unanticipated injuries affiliated with laparoscopic surgery.   Reviewed that removing the gallbladder will only address the symptoms related to the gallbladder itself.  I believe there is the desire to proceed, accepting the risks with understanding.  Questions elicited and answered to satisfaction.    No guarantees ever expressed or implied.   Face-to-face time spent with the patient and accompanying care providers(if present) was 30 minutes, with more than 50% of the time spent counseling, educating, and coordinating care of the patient.    These notes generated with voice recognition software. I apologize for typographical errors.  Ronny Bacon M.D., FACS 09/20/2022, 8:56 AM

## 2022-09-20 ENCOUNTER — Telehealth: Payer: Self-pay

## 2022-09-20 ENCOUNTER — Ambulatory Visit: Payer: Medicare Other | Admitting: Surgery

## 2022-09-20 ENCOUNTER — Other Ambulatory Visit: Payer: Self-pay

## 2022-09-20 ENCOUNTER — Telehealth: Payer: Self-pay | Admitting: Surgery

## 2022-09-20 ENCOUNTER — Encounter: Payer: Self-pay | Admitting: Surgery

## 2022-09-20 VITALS — BP 153/89 | HR 94 | Temp 98.1°F | Ht 64.0 in | Wt 147.0 lb

## 2022-09-20 DIAGNOSIS — N8500 Endometrial hyperplasia, unspecified: Secondary | ICD-10-CM | POA: Insufficient documentation

## 2022-09-20 DIAGNOSIS — K8010 Calculus of gallbladder with chronic cholecystitis without obstruction: Secondary | ICD-10-CM | POA: Diagnosis not present

## 2022-09-20 DIAGNOSIS — N951 Menopausal and female climacteric states: Secondary | ICD-10-CM | POA: Insufficient documentation

## 2022-09-20 DIAGNOSIS — K429 Umbilical hernia without obstruction or gangrene: Secondary | ICD-10-CM | POA: Insufficient documentation

## 2022-09-20 DIAGNOSIS — N898 Other specified noninflammatory disorders of vagina: Secondary | ICD-10-CM | POA: Insufficient documentation

## 2022-09-20 NOTE — Telephone Encounter (Signed)
Cardiac Clearance faxed to Dr.Ajay Kadakia-patient has scheduled appointment 09/21/22 .

## 2022-09-20 NOTE — Progress Notes (Unsigned)
Cardiology Clearance has been received from Dr Dixie Dials. The patient is cleared at Low risk for surgery.

## 2022-09-20 NOTE — Patient Instructions (Addendum)
Cardiac Clearance faxed to [facility].   Our surgery scheduler will call you within 24-48 hours to schedule your surgery. Please have the [facility] surgery sheet available when speaking with her.  Gallbladder Eating Plan High blood cholesterol, obesity, a sedentary lifestyle, an unhealthy diet, and diabetes are risk factors for developing gallstones. If you have a gallbladder condition, you may have trouble digesting fats and tolerating high fat intake. Eating a low-fat diet can help reduce your symptoms and may be helpful before and after having surgery to remove your gallbladder (cholecystectomy). Your health care provider may recommend that you work with a dietitian to help you reduce the amount of fat in your diet. What are tips for following this plan? General guidelines Limit your fat intake to less than 30% of your total daily calories. If you eat around 1,800 calories each day, this means eating less than 60 grams (g) of fat per day. Fat is an important part of a healthy diet. Eating a low-fat diet can make it hard to maintain a healthy body weight. Ask your dietitian how much fat, calories, and other nutrients you need each day. Eat small, frequent meals throughout the day instead of three large meals. Drink at least 8-10 cups (1.9-2.4 L) of fluid a day. Drink enough fluid to keep your urine pale yellow. If you drink alcohol: Limit how much you have to: 0-1 drink a day for women who are not pregnant. 0-2 drinks a day for men. Know how much alcohol is in a drink. In the [place] one drink equals one 12 oz bottle of beer (355 mL), one 5 oz glass of wine (148 mL), or one 1 oz glass of hard liquor (44 mL). Reading food labels  Check nutrition facts on food labels for the amount of fat per serving. Choose foods with less than 3 grams of fat per serving. Shopping Choose nonfat and low-fat healthy foods. Look for the words "nonfat," "low-fat," or "fat-free." Avoid buying processed or  prepackaged foods. Cooking Cook using low-fat methods, such as baking, broiling, grilling, or boiling. Cook with small amounts of healthy fats, such as olive oil, grapeseed oil, canola oil, avocado oil, or sunflower oil. What foods are recommended?  All fresh, frozen, or canned fruits and vegetables. Whole grains. Low-fat or nonfat (skim) milk and yogurt. Lean meat, skinless poultry, fish, eggs, and beans. Low-fat protein supplement powders or drinks. Spices and herbs. The items listed above may not be a complete list of foods and beverages you can eat and drink. Contact a dietitian for more information. What foods are not recommended? High-fat foods. These include baked goods, fast food, fatty cuts of meat, ice cream, french toast, sweet rolls, pizza, cheese bread, foods covered with butter, creamy sauces, or cheese. Fried foods. These include french fries, tempura, battered fish, breaded chicken, fried breads, and sweets. Foods that cause bloating and gas. The items listed above may not be a complete list of foods that you should avoid. Contact a dietitian for more information. Summary A low-fat diet can be helpful if you have a gallbladder condition, or before and after gallbladder surgery. Limit your fat intake to less than 30% of your total daily calories. This is about 60 g of fat if you eat 1,800 calories each day. Eat small, frequent meals throughout the day instead of three large meals. This information is not intended to replace advice given to you by your health care provider. Make sure you discuss any questions you have with  your health care provider. Document Revised: 06/11/2021 Document Reviewed: 06/11/2021 Elsevier Patient Education  [place]. Minimally Invasive Cholecystectomy Minimally invasive cholecystectomy is surgery to remove the gallbladder. The gallbladder is a pear-shaped organ that lies beneath the liver on the right side of the body. The gallbladder  stores bile, which is a fluid that helps the body digest fats. Cholecystectomy is often done to treat inflammation (irritation and swelling) of the gallbladder (cholecystitis). This condition is usually caused by a buildup of gallstones (cholelithiasis) in the gallbladder or when the fluid in the gall bladder becomes stagnant because gallstones get stuck in the ducts (tubes) and block the flow of bile. This can result in inflammation and pain. In severe cases, emergency surgery may be required. This procedure is done through small incisions in the abdomen, instead of one large incision. It is also called laparoscopic surgery. A thin scope with a camera (laparoscope) is inserted through one incision. Then surgical instruments are inserted through the other incisions. In some cases, a minimally invasive surgery may need to be changed to a surgery that is done through a larger incision. This is called open surgery. Tell a health care provider about: Any allergies you have. All medicines you are taking, including vitamins, herbs, eye drops, creams, and over-the-counter medicines. Any problems you or family members have had with anesthetic medicines. Any bleeding problems you have. Any surgeries you have had. Any medical conditions you have. Whether you are pregnant or may be pregnant. What are the risks? Generally, this is a safe procedure. However, problems may occur, including: Infection. Bleeding. Allergic reactions to medicines. Damage to nearby structures or organs. A gallstone remaining in the common bile duct. The common bile duct carries bile from the gallbladder to the small intestine. A bile leak from the liver or cystic duct after your gallbladder is removed. What happens before the procedure? When to stop eating and drinking Follow instructions from your health care provider about what you may eat and drink before your procedure. These may include: 8 hours before the procedure Stop  eating most foods. Do not eat meat, fried foods, or fatty foods. Eat only light foods, such as toast or crackers. All liquids are okay except energy drinks and alcohol. 6 hours before the procedure Stop eating. Drink only clear liquids, such as water, clear fruit juice, black coffee, plain tea, and sports drinks. Do not drink energy drinks or alcohol. 2 hours before the procedure Stop drinking all liquids. You may be allowed to take medicines with small sips of water. If you do not follow your health care provider's instructions, your procedure may be delayed or canceled. Medicines Ask your health care provider about: Changing or stopping your regular medicines. This is especially important if you are taking diabetes medicines or blood thinners. Taking medicines such as aspirin and ibuprofen. These medicines can thin your blood. Do not take these medicines unless your health care provider tells you to take them. Taking over-the-counter medicines, vitamins, herbs, and supplements. General instructions If you will be going home right after the procedure, plan to have a responsible adult: Take you home from the hospital or clinic. You will not be allowed to drive. Care for you for the time you are told. Do not use any products that contain nicotine or tobacco for at least 4 weeks before the procedure. These products include cigarettes, chewing tobacco, and vaping devices, such as e-cigarettes. If you need help quitting, ask your health care provider. Ask your  health care provider: How your surgery site will be marked. What steps will be taken to help prevent infection. These may include: Removing hair at the surgery site. Washing skin with a germ-killing soap. Taking antibiotic medicine. What happens during the procedure?  An IV will be inserted into one of your veins. You will be given one or both of the following: A medicine to help you relax (sedative). A medicine to make you fall  asleep (general anesthetic). Your surgeon will make several small incisions in your abdomen. The laparoscope will be inserted through one of the small incisions. The camera on the laparoscope will send images to a monitor in the operating room. This lets your surgeon see inside your abdomen. A gas will be pumped into your abdomen. This will expand your abdomen to give the surgeon more room to perform the surgery. Other tools that are needed for the procedure will be inserted through the other incisions. The gallbladder will be removed through one of the incisions. Your common bile duct may be examined. If stones are found in the common bile duct, they may be removed. After your gallbladder has been removed, the incisions will be closed with stitches (sutures), staples, or skin glue. Your incisions will be covered with a bandage (dressing). The procedure may vary among health care providers and hospitals. What happens after the procedure? Your blood pressure, heart rate, breathing rate, and blood oxygen level will be monitored until you leave the hospital or clinic. You will be given medicines as needed to control your pain. You may have a drain placed in the incision. The drain will be removed a day or two after the procedure. Summary Minimally invasive cholecystectomy, also called laparoscopic cholecystectomy, is surgery to remove the gallbladder using small incisions. Tell your health care provider about all the medical conditions you have and all the medicines you are taking for those conditions. Before the procedure, follow instructions about when to stop eating and drinking and changing or stopping medicines. Plan to have a responsible adult care for you for the time you are told after you leave the hospital or clinic. This information is not intended to replace advice given to you by your health care provider. Make sure you discuss any questions you have with your health care  provider. Document Revised: 12/29/2020 Document Reviewed: 12/29/2020 Elsevier Patient Education  [place]. Cholelithiasis  Cholelithiasis is a disease in which gallstones form in the gallbladder. The gallbladder is an organ that stores bile. Bile is a fluid that helps to digest fats. Gallstones begin as small crystals and can slowly grow into stones. They may cause no symptoms until they block the gallbladder duct, or cystic duct, when the gallbladder tightens, or contracts, after food is eaten. This can cause pain and is known as a gallbladder attack, or biliary colic. There are two main types of gallstones: Cholesterol stones. These are the most common type of gallstone. These stones are made of hardened cholesterol and are usually yellow-green in color. Pigment stones. These are dark in color and are made of a red-yellow substance, called bilirubin,that forms when hemoglobin from red blood cells breaks down. What are the causes? This condition may be caused by too little or too much of the substances that are in bile. This can happen if the bile: Has too much bilirubin. This can happen in certain blood diseases, such as sickle cell anemia. Has too much cholesterol. Does not have enough bile salts. These salts help the  body absorb and digest fats. It can also happen if the gallbladder is not emptying completely. This is common during pregnancy. What increases the risk? The following factors may make you more likely to develop this condition: Being older than 83 years of age. Eating a diet that is heavy in fried foods, fat, and refined carbohydrates, such as white bread and white rice. Being female. Having multiple pregnancies. Using medicines that contain female hormones (estrogen) for a long time. Having certain medical problems, such as: Diabetes mellitus. Obesity. Cystic fibrosis. Crohn's disease. Cirrhosis or other long-term (chronic) liver disease. Certain blood diseases,  such as sickle cell anemia or leukemia. Having a family history of gallstones. Losing weight quickly. What are the signs or symptoms? In many cases, having gallstones causes no symptoms. These are called silent gallstones. If a gallstone blocks your bile duct, it can cause a gallbladder attack. The main symptom of a gallbladder attack is sudden pain in the upper right part of the abdomen. The pain: Usually comes at night or after eating. Can last for one hour or more. Can spread to your right shoulder, back, or chest. Can feel like indigestion. This is discomfort, burning, or fullness in your upper abdomen. If the bile duct is blocked for more than a few hours, it can cause an infection or inflammation of your gallbladder (cholecystitis), liver, or pancreas. This can cause: Nausea or vomiting. Bloating. Pain in your abdomen that lasts for 5 hours or longer. Tenderness in your upper abdomen, often in the upper right section and under your rib cage. Fever or chills. Skin or the white parts of your eyes turning yellow (jaundice). This usually happens when a stone has blocked bile from passing through the bile duct. Dark pee (urine) or light-colored poop (stools). How is this diagnosed? This condition may be diagnosed based on: A physical exam. Your medical history. Ultrasound. CT scan. MRI. You may also have other tests, including: Blood tests to check for infection or inflammation. The HIDA scan to see the gallbladder and the bile ducts. An endoscope to check for blockage in the bile ducts. How is this treated? Treatment depends on the severity of your symptoms. Silent gallstones do not need treatment. You may need treatment if a blockage causes a gallbladder attack or other symptoms. Treatment may include: If symptoms are mild, you may care for yourself at home. For mild symptoms: Stop eating and drinking for 12-24 hours. You may drink water and clear liquids. This helps to "cool down"  your gallbladder. After 1 or 2 days, eat a diet of simple or clear foods, such as broths and crackers. Take medicines for pain or nausea. Take antibiotics if you have an infection. If symptoms are severe, you may: Stay in the hospital for pain control or to treat severe infection. Have surgery to remove the gallbladder (cholecystectomy). This is the most common treatment if all other treatments have not worked. Take medicines to break up gallstones. Medicines may be used for up to 6-12 months. Have an procedure to capture and remove gallstones. Follow these instructions at home: Medicines Take over-the-counter and prescription medicines only as told by your health care provider. If you were prescribed antibiotics, take them as told by your provider. Do not stop using the antibiotic even if you start to feel better. Ask your provider if the medicine prescribed to you requires you to avoid driving or using machinery. Eating and drinking Drink enough fluid to keep your pee pale yellow. This is  important during a gallbladder attack. Water and clear liquids are preferred. Follow a healthy diet. This includes: Reducing fatty foods, such as fried food and foods high in cholesterol. Reducing refined carbohydrates, such as white bread and white rice. Eating more fiber. Aim for foods such as almonds, fruit, and beans. General instructions Do not use any products that contain nicotine or tobacco. These products include cigarettes, chewing tobacco, and vaping devices, such as e-cigarettes. If you need help quitting, ask your provider. Maintain a healthy weight. Keep all follow-up visits. These may include seeing a specialist or a Psychologist, sport and exercise. Where to find more information [employer] of Diabetes and Digestive and Kidney Diseases: [email or web address] Contact a health care provider if: You think you have had a gallbladder attack. You have been diagnosed with silent gallstones and you develop indigestion  or pain in your abdomen. You have pain from a gallbladder attack that lasts for more than 2 hours. You begin to have attacks more often. You have nausea. You have dark pee or light-colored poop. Get help right away if: You have pain in your abdomen that lasts for more than 5 hours or is getting worse. You have a fever or chills. You have vomiting that does not go away. You develop jaundice. This information is not intended to replace advice given to you by your health care provider. Make sure you discuss any questions you have with your health care provider. Document Revised: 04/11/2022 Document Reviewed: 04/11/2022 Elsevier Patient Education  [place].

## 2022-09-20 NOTE — Telephone Encounter (Signed)
We did receive cardiac clearance for surgery.  When calling the patient to schedule, she states that wants to hold for now as she wants to speak with her children before scheduling anything. Will call us back when ready.

## 2022-09-29 ENCOUNTER — Telehealth: Payer: Self-pay | Admitting: Surgery

## 2022-09-29 NOTE — Telephone Encounter (Signed)
Cardiac clearance has been received. Left message for patient to call to see if she is ready to schedule robotic cholecystectomy with Dr. Christian Mate.

## 2022-10-05 ENCOUNTER — Telehealth: Payer: Self-pay | Admitting: Surgery

## 2022-10-05 NOTE — Telephone Encounter (Signed)
Left message again for patient to call the office so that she can let us know whether or not she wants to proceed with robotic cholecystectomy with Dr. Christian Mate.

## 2022-10-10 ENCOUNTER — Telehealth: Payer: Self-pay | Admitting: Surgery

## 2022-10-10 NOTE — Telephone Encounter (Signed)
To date patient has yet to return any of our calls regarding scheduling of surgery with  Dr. Christian Mate for robotic cholecystectomy.  If patient does return, will need another office visit with the doctor to update H&P.

## 2022-10-28 ENCOUNTER — Encounter: Payer: Medicare Other | Admitting: Internal Medicine

## 2022-11-01 ENCOUNTER — Encounter: Payer: Medicare Other | Admitting: Internal Medicine

## 2022-11-07 ENCOUNTER — Encounter: Payer: Self-pay | Admitting: Internal Medicine

## 2022-11-07 ENCOUNTER — Ambulatory Visit (INDEPENDENT_AMBULATORY_CARE_PROVIDER_SITE_OTHER): Payer: Medicare Other | Admitting: Internal Medicine

## 2022-11-07 VITALS — BP 130/80 | HR 91 | Temp 97.6°F | Ht 62.0 in | Wt 144.0 lb

## 2022-11-07 DIAGNOSIS — R200 Anesthesia of skin: Secondary | ICD-10-CM | POA: Diagnosis not present

## 2022-11-07 DIAGNOSIS — K219 Gastro-esophageal reflux disease without esophagitis: Secondary | ICD-10-CM | POA: Diagnosis not present

## 2022-11-07 DIAGNOSIS — I10 Essential (primary) hypertension: Secondary | ICD-10-CM

## 2022-11-07 DIAGNOSIS — K8010 Calculus of gallbladder with chronic cholecystitis without obstruction: Secondary | ICD-10-CM | POA: Diagnosis not present

## 2022-11-07 DIAGNOSIS — F39 Unspecified mood [affective] disorder: Secondary | ICD-10-CM

## 2022-11-07 DIAGNOSIS — N1831 Chronic kidney disease, stage 3a: Secondary | ICD-10-CM | POA: Insufficient documentation

## 2022-11-07 DIAGNOSIS — Z0000 Encounter for general adult medical examination without abnormal findings: Secondary | ICD-10-CM | POA: Insufficient documentation

## 2022-11-07 LAB — RENAL FUNCTION PANEL
Albumin: 4.2 g/dL (ref 3.5–5.2)
BUN: 11 mg/dL (ref 6–23)
CO2: 28 mEq/L (ref 19–32)
Calcium: 9.6 mg/dL (ref 8.4–10.5)
Chloride: 104 mEq/L (ref 96–112)
Creatinine, Ser: 0.93 mg/dL (ref 0.40–1.20)
GFR: 57.18 mL/min — ABNORMAL LOW (ref >60.00)
Glucose, Bld: 83 mg/dL (ref 70–99)
Phosphorus: 3.5 mg/dL (ref 2.3–4.6)
Potassium: 3.6 mEq/L (ref 3.5–5.1)
Sodium: 143 mEq/L (ref 135–145)

## 2022-11-07 LAB — VITAMIN B12: Vitamin B-12: >1500 pg/mL — ABNORMAL HIGH (ref 211–911)

## 2022-11-07 LAB — TSH: TSH: 1.01 u[IU]/mL (ref 0.35–5.50)

## 2022-11-07 NOTE — Progress Notes (Signed)
Hearing Screening - Comments:: Passed whisper test Vision Screening - Comments:: March 2024  
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
## 2022-11-07 NOTE — Assessment & Plan Note (Signed)
BP Readings from Last 3 Encounters:  11/07/22 130/80  09/20/22 (!) 153/89  09/07/22 (!) 158/95   Good control now on amlodipine/benazepril 2.5/10

## 2022-11-07 NOTE — Assessment & Plan Note (Signed)
Has the omeprazole for prn use

## 2022-11-07 NOTE — Assessment & Plan Note (Signed)
Borderline low in February Will stop the furosemide--and potassium if normal now

## 2022-11-07 NOTE — Assessment & Plan Note (Signed)
Neuropathy in feet Will check labs

## 2022-11-07 NOTE — Assessment & Plan Note (Signed)
I have personally reviewed the Medicare Annual Wellness questionnaire and have noted 1. The patient's medical and social history 2. Their use of alcohol, tobacco or illicit drugs 3. Their current medications and supplements 4. The patient's functional ability including ADL's, fall risks, home safety risks and hearing or visual             impairment. 5. Diet and physical activities 6. Evidence for depression or mood disorders  The patients weight, height, BMI and visual acuity have been recorded in the chart I have made referrals, counseling and provided education to the patient based review of the above and I have provided the pt with a written personalized care plan for preventive services.  I have provided you with a copy of your personalized plan for preventive services. Please take the time to review along with your updated medication list.  Done with cancer screening due to age Exercises regularly Recommended updated COVID vaccine Flu/RSV in the fall

## 2022-11-07 NOTE — Assessment & Plan Note (Signed)
Quiet again Decided against elective surgery for now

## 2022-11-07 NOTE — Assessment & Plan Note (Signed)
Chronic low level anxiety Mild physical symptoms--chest pain--that prompted cardiology evaluation Does okay without meds

## 2022-11-08 ENCOUNTER — Other Ambulatory Visit: Payer: Self-pay

## 2022-11-08 MED ORDER — POTASSIUM CHLORIDE CRYS ER 20 MEQ PO TBCR: 20.0000 meq | EXTENDED_RELEASE_TABLET | Freq: Every day | ORAL | 3 refills | Status: AC

## 2023-11-09 ENCOUNTER — Ambulatory Visit (INDEPENDENT_AMBULATORY_CARE_PROVIDER_SITE_OTHER): Payer: Medicare Other | Admitting: Internal Medicine

## 2023-11-09 ENCOUNTER — Encounter: Payer: Self-pay | Admitting: Internal Medicine

## 2023-11-09 VITALS — BP 122/70 | HR 89 | Temp 98.3°F | Ht 62.5 in | Wt 145.0 lb

## 2023-11-09 DIAGNOSIS — N1831 Chronic kidney disease, stage 3a: Secondary | ICD-10-CM | POA: Diagnosis not present

## 2023-11-09 DIAGNOSIS — F39 Unspecified mood [affective] disorder: Secondary | ICD-10-CM | POA: Diagnosis not present

## 2023-11-09 DIAGNOSIS — K219 Gastro-esophageal reflux disease without esophagitis: Secondary | ICD-10-CM

## 2023-11-09 DIAGNOSIS — K529 Noninfective gastroenteritis and colitis, unspecified: Secondary | ICD-10-CM

## 2023-11-09 DIAGNOSIS — Z0000 Encounter for general adult medical examination without abnormal findings: Secondary | ICD-10-CM | POA: Diagnosis not present

## 2023-11-09 DIAGNOSIS — R200 Anesthesia of skin: Secondary | ICD-10-CM | POA: Diagnosis not present

## 2023-11-09 DIAGNOSIS — I10 Essential (primary) hypertension: Secondary | ICD-10-CM | POA: Diagnosis not present

## 2023-11-09 LAB — CBC
HCT: 41.3 % (ref 36.0–46.0)
Hemoglobin: 13.6 g/dL (ref 12.0–15.0)
MCHC: 32.9 g/dL (ref 30.0–36.0)
MCV: 87.1 fl (ref 78.0–100.0)
Platelets: 212 10*3/uL (ref 150.0–400.0)
RBC: 4.75 Mil/uL (ref 3.87–5.11)
RDW: 14.4 % (ref 11.5–15.5)
WBC: 4.5 10*3/uL (ref 4.0–10.5)

## 2023-11-09 LAB — LIPID PANEL
Cholesterol: 218 mg/dL — ABNORMAL HIGH (ref 0–200)
HDL: 67.8 mg/dL (ref >39.00)
LDL Cholesterol: 117 mg/dL — ABNORMAL HIGH (ref 0–99)
NonHDL: 150.23
Total CHOL/HDL Ratio: 3
Triglycerides: 167 mg/dL — ABNORMAL HIGH (ref 0.0–149.0)
VLDL: 33.4 mg/dL (ref 0.0–40.0)

## 2023-11-09 LAB — RENAL FUNCTION PANEL
Albumin: 4 g/dL (ref 3.5–5.2)
BUN: 12 mg/dL (ref 6–23)
CO2: 31 meq/L (ref 19–32)
Calcium: 9.6 mg/dL (ref 8.4–10.5)
Chloride: 105 meq/L (ref 96–112)
Creatinine, Ser: 0.97 mg/dL (ref 0.40–1.20)
GFR: 53.98 mL/min — ABNORMAL LOW (ref >60.00)
Glucose, Bld: 83 mg/dL (ref 70–99)
Phosphorus: 3.1 mg/dL (ref 2.3–4.6)
Potassium: 3.9 meq/L (ref 3.5–5.1)
Sodium: 143 meq/L (ref 135–145)

## 2023-11-09 LAB — HEPATIC FUNCTION PANEL
ALT: 9 U/L (ref 0–35)
AST: 17 U/L (ref 0–37)
Albumin: 4 g/dL (ref 3.5–5.2)
Alkaline Phosphatase: 90 U/L (ref 39–117)
Bilirubin, Direct: 0.1 mg/dL (ref 0.0–0.3)
Total Bilirubin: 0.8 mg/dL (ref 0.2–1.2)
Total Protein: 6.7 g/dL (ref 6.0–8.3)

## 2023-11-09 LAB — VITAMIN B12: Vitamin B-12: >1537 pg/mL — ABNORMAL HIGH (ref 211–911)

## 2023-11-09 LAB — VITAMIN D 25 HYDROXY (VIT D DEFICIENCY, FRACTURES): VITD: 41.25 ng/mL (ref 30.00–100.00)

## 2023-11-09 NOTE — Progress Notes (Signed)
 Subjective:    Patient ID: Sara Baker, female    DOB: 1940-12-03, 83 y.o.   MRN: [id number]  HPI Here for Medicare wellness visit and follow up of chronic health conditions Reviewed advanced directives Reviewed other doctors---Dr McFarland--opto, Dr Kadaki--cardiology, [facility] No hospitalizations or surgery in the past year [facility] Vision is fine Hearing is fine--does get some ear aching on left (and to jaw at times) No alcohol or tobacco No falls Independent with instrumental ADLs No sig memory issues  GFR has been consistently in the high 50's  Past anxiety--but better since leaving work Still gets bored after retiring No persistent depression and not anhedonic  Had colitis attack a few months ago---son brought her to ER After eating fried onion ring---still has to be careful with what she eats Uses the omeprazole prn No dysphagia  No chest pain No SOB No palpitations---but got medication from cardiologist (metoprolol) for racing (but doesn't feel that) No dizziness or syncope--rare vertigo with sinus problems No edema  Current Outpatient Medications on File Prior to Visit  Medication Sig Dispense Refill   amLODipine-benazepril  (LOTREL) 10-40 MG capsule Take 1 capsule by mouth at bedtime.     Calcium -Magnesium -Zinc  1000-400-15 MG TABS Take 1 tablet by mouth daily.     Cholecalciferol (VITAMIN D3) 1000 UNITS CAPS Take 1,000 Units by mouth daily.     Cyanocobalamin  (VITAMIN B 12 PO) Take 1,000 mg by mouth daily.     Flaxseed, Linseed, (FLAX SEED OIL PO) Take by mouth daily. Pt. Not sure of dose but maybe 400mg .     fluticasone  (FLONASE ) 50 MCG/ACT nasal spray 1 spray daily as needed.     Magnesium  Hydroxide (DULCOLAX PO) Take 1 tablet by mouth as needed.     omeprazole (PRILOSEC) 20 MG capsule Take 20 mg by mouth daily as needed.     potassium chloride  SA (KLOR-CON  M20) 20 MEQ tablet Take 1 tablet (20 mEq total) by mouth daily. 90 tablet 3    Sennosides (EX-LAX PO) Take 1 tablet by mouth as needed.     No current facility-administered medications on file prior to visit.    Allergies  Allergen Reactions   Medrol  [Methylprednisolone ] Other (See Comments)    Increase blood pressure     Past Medical History:  Diagnosis Date   Anxiety    Arthritis    fingers, knee - otc meds prn   ASCVD (arteriosclerotic cardiovascular disease)    mild microvascular changes   Asthma    Blood transfusion 10 + yrs   Wash DC   Cataract    Chronic seasonal allergic rhinitis due to pollen    Chronic sinusitis    Colitis    Vs IBS   GERD (gastroesophageal reflux disease)    Hyperlipidemia    Hypertension    Hypothyroidism    OSA (obstructive sleep apnea)    AHI 11   SVD (spontaneous vaginal delivery)    x 5   Vestibular migraine     Past Surgical History:  Procedure Laterality Date   ABDOMINAL HYSTERECTOMY  07/05/2012   Procedure: HYSTERECTOMY ABDOMINAL;  Surgeon: Romilda Coaster, MD;  Location: [facility];  Service: Gynecology;  Laterality: N/A;   BREAST SURGERY  1968   benign tumor   CATARACT EXTRACTION W/ INTRAOCULAR LENS  IMPLANT, BILATERAL  2011   CATARACT EXTRACTION W/ INTRAOCULAR LENS & ANTERIOR VITRECTOMY Bilateral    COLONOSCOPY     DILATION AND CURETTAGE OF UTERUS  2013   POLYPECTOMY     SALPINGOOPHORECTOMY  07/05/2012   Procedure: SALPINGO OOPHORECTOMY;  Surgeon: Romilda Coaster, MD;  Location: [facility];  Service: Gynecology;  Laterality: Bilateral;   THYROID  SURGERY  2007   on parathyroids   TUBAL LIGATION  1982    Family History  Problem Relation Age of Onset   Hypertension Mother    Stroke Mother        hemorrhage   Hypertension Father    Stroke Father    Kidney disease Sister        ESRD--dialysis   Hypertension Sister    Hypertension Brother    Colon polyps Son    Colon cancer Neg Hx    Esophageal cancer Neg Hx    Rectal cancer Neg Hx    Stomach cancer Neg Hx    Pancreatic cancer Neg Hx      Social History   Socioeconomic History   Marital status: Widowed    Spouse name: Not on file   Number of children: 5   Years of education: Not on file   Highest education level: Not on file  Occupational History   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    Comment: Retired  Tobacco Use   Smoking status: Never    Passive exposure: Never   Smokeless tobacco: Never  Vaping Use   Vaping status: Never Used  Substance and Sexual Activity   Alcohol use: No   Drug use: No   Sexual activity: Yes    Birth control/protection: Surgical  Other Topics Concern   Not on file  Social History Narrative   Widowed 2002   2 sons, 3 daughters      No living will   Daughter Wallene Gum should make medical decisions   Would accept resuscitation   Would accept tube feeds--but not if cognitively unaware   Social Drivers of Corporate investment banker Strain: Not on file  Food Insecurity: Not on file  Transportation Needs: Not on file  Physical Activity: Not on file  Stress: Not on file  Social Connections: Not on file  Intimate Partner Violence: Not on file   Review of Systems Appetite is fine Weight is stable in last year---lost 20# after starting regular gym work Not a good sleeper--nocturia persists, but better No daytime somnolence Now has full upper plate and partial lower--ongoing problems Some chronic right ankle issues from past trauma--uses brace occasionally Chronic back pain--no meds. If prolonged sitting/standing No suspicious skin lesions    Objective:   Physical Exam Constitutional:      Appearance: Normal appearance.  HENT:     Head:     Comments: No TMJ tenderness--but that is sort of where the pain is (will discuss with dentist)    Right Ear: Tympanic membrane and ear canal normal.     Left Ear: Tympanic membrane and ear canal normal.     Mouth/Throat:     Pharynx: No oropharyngeal exudate or posterior oropharyngeal erythema.  Eyes:     Conjunctiva/sclera:  Conjunctivae normal.     Pupils: Pupils are equal, round, and reactive to light.  Cardiovascular:     Rate and Rhythm: Normal rate and regular rhythm.     Pulses: Normal pulses.     Heart sounds: No murmur heard.    No gallop.  Pulmonary:     Effort: Pulmonary effort is normal.     Breath sounds: Normal breath sounds. No wheezing or rales.  Abdominal:     Palpations:  Abdomen is soft.     Tenderness: There is no abdominal tenderness.  Musculoskeletal:     Cervical back: Neck supple.     Right lower leg: No edema.     Left lower leg: No edema.  Lymphadenopathy:     Cervical: No cervical adenopathy.  Skin:    Findings: No lesion or rash.  Neurological:     General: No focal deficit present.     Mental Status: She is alert and oriented to person, place, and time.     Comments: Min-cog---normal  Psychiatric:        Mood and Affect: Mood normal.        Behavior: Behavior normal.            Assessment & Plan:

## 2023-11-09 NOTE — Assessment & Plan Note (Signed)
 I have personally reviewed the Medicare Annual Wellness questionnaire and have noted 1. The patient's medical and social history 2. Their use of alcohol, tobacco or illicit drugs 3. Their current medications and supplements 4. The patient's functional ability including ADL's, fall risks, home safety risks and hearing or visual             impairment. 5. Diet and physical activities 6. Evidence for depression or mood disorders  The patients weight, height, BMI and visual acuity have been recorded in the chart I have made referrals, counseling and provided education to the patient based review of the above and I have provided the pt with a written personalized care plan for preventive services.  I have provided you with a copy of your personalized plan for preventive services. Please take the time to review along with your updated medication list.  Done with cancer screening One time RSV soon Flu/COVID vaccines in the fall Goes to gym daily

## 2023-11-09 NOTE — Assessment & Plan Note (Signed)
 Uses the omeprazole prn only

## 2023-11-09 NOTE — Assessment & Plan Note (Signed)
 Does okay if avoids fatty foods

## 2023-11-09 NOTE — Assessment & Plan Note (Signed)
 BP Readings from Last 3 Encounters:  11/09/23 122/70  11/07/22 130/80  09/20/22 (!) 153/89   Controlled on the amlodipine/benazepril  10/40

## 2023-11-09 NOTE — Progress Notes (Signed)
 Hearing Screening - Comments:: Passed whisper test Vision Screening - Comments:: November 2024

## 2023-11-09 NOTE — Assessment & Plan Note (Signed)
 Has been stable for some years Is on the benazepril 

## 2023-11-09 NOTE — Assessment & Plan Note (Signed)
 Past anxiety is mostly better Some "boredom" but not depressed No Rx needed

## 2023-11-10 LAB — PARATHYROID HORMONE, INTACT (NO CA): PTH: 29 pg/mL (ref 16–77)

## 2024-02-19 ENCOUNTER — Encounter: Payer: Self-pay | Admitting: Internal Medicine
